# Patient Record
Sex: Male | Born: 1960 | Race: White | Hispanic: No | Smoking: Current some day smoker
Health system: Southern US, Community
[De-identification: ages and names within clinical notes are randomized; demographics above are authoritative.]

## PROBLEM LIST (undated history)

## (undated) DIAGNOSIS — T7840XA Allergy, unspecified, initial encounter: Secondary | ICD-10-CM

## (undated) DIAGNOSIS — I82409 Acute embolism and thrombosis of unspecified deep veins of unspecified lower extremity: Secondary | ICD-10-CM

## (undated) DIAGNOSIS — E785 Hyperlipidemia, unspecified: Secondary | ICD-10-CM

## (undated) DIAGNOSIS — E559 Vitamin D deficiency, unspecified: Secondary | ICD-10-CM

## (undated) DIAGNOSIS — M545 Low back pain, unspecified: Secondary | ICD-10-CM

## (undated) HISTORY — DX: Allergy, unspecified, initial encounter: T78.40XA

## (undated) HISTORY — DX: Hyperlipidemia, unspecified: E78.5

## (undated) HISTORY — PX: WISDOM TOOTH EXTRACTION: SHX21

---

## 2007-12-18 ENCOUNTER — Ambulatory Visit (HOSPITAL_COMMUNITY): Admission: RE | Admit: 2007-12-18 | Discharge: 2007-12-18 | Payer: Self-pay | Admitting: Internal Medicine

## 2012-06-27 ENCOUNTER — Ambulatory Visit (INDEPENDENT_AMBULATORY_CARE_PROVIDER_SITE_OTHER): Payer: BC Managed Care – PPO | Admitting: *Deleted

## 2012-06-27 DIAGNOSIS — M79609 Pain in unspecified limb: Secondary | ICD-10-CM

## 2012-06-27 DIAGNOSIS — M7989 Other specified soft tissue disorders: Secondary | ICD-10-CM

## 2012-07-03 ENCOUNTER — Telehealth: Payer: Self-pay | Admitting: Oncology

## 2012-07-03 NOTE — Telephone Encounter (Signed)
S/W pt in re NP appt 10/22 @ 10:30 w/Dr. Clelia Croft.  Referring Dr. Lucky Cowboy Dx- Abn CBC w/ hx superficial phlebitis x'2. NP packet email.

## 2012-07-03 NOTE — Telephone Encounter (Signed)
C/D 07/03/12 for appt. 07/10/12

## 2012-07-06 ENCOUNTER — Other Ambulatory Visit: Payer: Self-pay | Admitting: Oncology

## 2012-07-06 DIAGNOSIS — I8290 Acute embolism and thrombosis of unspecified vein: Secondary | ICD-10-CM

## 2012-07-10 ENCOUNTER — Encounter: Payer: Self-pay | Admitting: Oncology

## 2012-07-10 ENCOUNTER — Ambulatory Visit (HOSPITAL_BASED_OUTPATIENT_CLINIC_OR_DEPARTMENT_OTHER): Payer: BC Managed Care – PPO | Admitting: Oncology

## 2012-07-10 ENCOUNTER — Other Ambulatory Visit (HOSPITAL_BASED_OUTPATIENT_CLINIC_OR_DEPARTMENT_OTHER): Payer: BC Managed Care – PPO | Admitting: Lab

## 2012-07-10 ENCOUNTER — Telehealth: Payer: Self-pay | Admitting: Oncology

## 2012-07-10 ENCOUNTER — Ambulatory Visit: Payer: BC Managed Care – PPO

## 2012-07-10 VITALS — BP 108/73 | HR 65 | Temp 97.3°F | Resp 20 | Ht 71.0 in | Wt 183.0 lb

## 2012-07-10 DIAGNOSIS — I8000 Phlebitis and thrombophlebitis of superficial vessels of unspecified lower extremity: Secondary | ICD-10-CM

## 2012-07-10 DIAGNOSIS — I8290 Acute embolism and thrombosis of unspecified vein: Secondary | ICD-10-CM

## 2012-07-10 DIAGNOSIS — R634 Abnormal weight loss: Secondary | ICD-10-CM

## 2012-07-10 DIAGNOSIS — R7989 Other specified abnormal findings of blood chemistry: Secondary | ICD-10-CM

## 2012-07-10 DIAGNOSIS — F17200 Nicotine dependence, unspecified, uncomplicated: Secondary | ICD-10-CM

## 2012-07-10 LAB — CBC WITH DIFFERENTIAL/PLATELET
Basophils Absolute: 0.1 10*3/uL (ref 0.0–0.1)
EOS%: 2.5 % (ref 0.0–7.0)
HGB: 20.6 g/dL — ABNORMAL HIGH (ref 13.0–17.1)
MCH: 32.2 pg (ref 27.2–33.4)
MCHC: 33.9 g/dL (ref 32.0–36.0)
MCV: 95.1 fL (ref 79.3–98.0)
MONO%: 8 % (ref 0.0–14.0)
NEUT%: 68.6 % (ref 39.0–75.0)
RDW: 14.1 % (ref 11.0–14.6)

## 2012-07-10 NOTE — Progress Notes (Signed)
CHECKED IN NEW PATIENT. NO FINANCIAL ISSUES. °

## 2012-07-10 NOTE — Addendum Note (Signed)
Addended by: Benjiman Core on: 07/10/2012 12:14 PM   Modules accepted: Orders

## 2012-07-10 NOTE — Telephone Encounter (Signed)
appts made and printed for pt aom °

## 2012-07-10 NOTE — Progress Notes (Signed)
Note dictated

## 2012-07-11 NOTE — Progress Notes (Signed)
CC:   Loree Fee, [place] Lucky Cowboy, M.D.  REASON FOR CONSULTATION:  Superficial thrombophlebitis.  HISTORY OF PRESENT ILLNESS:  Mr. Broughton is a pleasant 51 year old man, a native of [place], with the majority of his life around this area. He is healthy gentleman without any significant past medical history, who back in July 2013, presented with ankle pain and found to have a superficial vein phlebitis noted on his left foot.  Patient treated with antibiotics and anti-inflammatory, however, continued to have persistent pain and tenderness in moving along the aspect of his left leg and left thigh.  The patient had an ultrasound Doppler done on 06/27/2012.  It showed no evidence of any deep vein thrombosis of the left lower extremity.  However, the left great saphenous vein has an occlusive superficial venous thrombophlebitis which appears to be subacute of the calf segment.  The patient was started on aspirin at that time, and referred to me for evaluation.  Also of note, from his primary care physician, noted that his hemoglobin is up to 19.3, white cell count is 6.4, platelet count 131.  Clinically, Mr. Hagemeister feels relatively well. He had lost some weight.  He is a smoker, but he denied any recent travel, has not had any trauma, did not have any period of immobilization.  He did not have any major symptoms.  He had not had any major abdominal discomfort, although he has had some abdominal discomfort at times.  He did not have any pruritus.  He did not have any major changes in his bowel habits.  REVIEW OF SYSTEMS:  He does not report any headaches, blurry vision, double vision.  He does not report any motor or sensory neuropathy.  He does not report any alteration in mental status.  He does not report any psychiatric issues, depression.  He does not report any fever, chills, sweats.  He does not report any cough, hemoptysis, hematemesis.  No nausea, vomiting.  He does not  report any abdominal pain.  No genitourinary complaints.  Rest of review of systems is unremarkable.  PAST MEDICAL HISTORY:  Really unremarkable.  No history of hypertension, diabetes or any malignancies.  MEDICATION:  None other than aspirin prescribed recently.  SOCIAL HISTORY:  He is single.  He has 3 daughters.  Smokes about 5-6 cigarettes a day.  No alcohol abuse.  Drinks about 3-4 beers at the most.  ALLERGIES:  NONE.  FAMILY HISTORY:  Really no history of any deep vein thrombosis.  No history of any specific malignancy or any pregnancy complications.  PHYSICAL EXAMINATION:  General:  Alert awake gentleman, appeared in no active distress.  Vital signs:  Blood pressure 108/73, pulse 65, respirations 20, temperature 97.  HEENT:  Head is normocephalic, atraumatic.  Pupils equal, round, and react to light.  Oral mucosa moist and pink.  Neck:  Supple without adenopathy.  Heart:  Regular rate and rhythm.  S1, S2.  Lungs:  Clear to auscultation without rhonchi, wheeze or dullness to percussion.  Abdomen:  Soft, nontender.  No hepatosplenomegaly.  Extremities:  No edema.  Examination of his left leg there is a palpable superficial vein tender to touch.  LABORATORY DATA:  Hemoglobin 20, white count 5.6, platelet count 121.  ASSESSMENT AND PLAN:  A 51 year old gentleman with the following issues: 1. Superficial phlebitis of his left calf segment of his venous     system.  Differential diagnosis discussed today with Mr. Nolton     including inherited thrombophilia versus  hormone supplements versus     steroid supplements at this time which he denies taking at this     point.  Also we talked about occult malignancy as a possible     offending agent.  Also given his erythrocytosis possibly have a     polycythemia as an offending agent.  At this point in terms of     management and workup, I will obtain a hypercoagulable workup.  At     this time also obtain JAK2 mutation to rule out  polycythemia vera,     and I will like to obtain a CT scan of the chest, abdomen and     pelvis to rule out occult malignancy that could be the cause of his     weight loss given his smoking history and the superficial     phlebitis.  He will follow up with me after that. 2. Elevated hemoglobin.  The differential diagnosis discussed today     including reactive polycythemia due to smoking versus a     polycythemia vera which could say in turn leads to his thrombosis.     I am obtaining a JAK2 mutation again to evaluate.  We talked about     a possible phlebotomy down the line.  He is already on aspirin for     the immediate future, but I talked to him about possible     therapeutic phlebotomy in that time.  All of his questions were     answered today.    ______________________________ Benjiman Core, M.D. FNS/MEDQ  D:  07/10/2012  T:  07/11/2012  Job:  [id number]

## 2012-07-12 LAB — HYPERCOAGULABLE PANEL, COMPREHENSIVE
DRVVT: 36.1 secs (ref <42.9)
Lupus Anticoagulant: NOT DETECTED
Protein C Activity: 152 % — ABNORMAL HIGH (ref 75–133)
Protein C, Total: 84 % (ref 72–160)

## 2012-07-13 ENCOUNTER — Ambulatory Visit (HOSPITAL_COMMUNITY)
Admission: RE | Admit: 2012-07-13 | Discharge: 2012-07-13 | Disposition: A | Discharge status: Home / Self Care | Payer: BC Managed Care – PPO | Source: Ambulatory Visit | Attending: Oncology | Admitting: Oncology

## 2012-07-13 DIAGNOSIS — D582 Other hemoglobinopathies: Secondary | ICD-10-CM | POA: Insufficient documentation

## 2012-07-13 DIAGNOSIS — R198 Other specified symptoms and signs involving the digestive system and abdomen: Secondary | ICD-10-CM | POA: Insufficient documentation

## 2012-07-13 DIAGNOSIS — I8000 Phlebitis and thrombophlebitis of superficial vessels of unspecified lower extremity: Secondary | ICD-10-CM | POA: Insufficient documentation

## 2012-07-13 DIAGNOSIS — R634 Abnormal weight loss: Secondary | ICD-10-CM

## 2012-07-13 DIAGNOSIS — R109 Unspecified abdominal pain: Secondary | ICD-10-CM | POA: Insufficient documentation

## 2012-07-13 DIAGNOSIS — I8290 Acute embolism and thrombosis of unspecified vein: Secondary | ICD-10-CM

## 2012-07-13 DIAGNOSIS — F17200 Nicotine dependence, unspecified, uncomplicated: Secondary | ICD-10-CM | POA: Insufficient documentation

## 2012-07-13 DIAGNOSIS — Z122 Encounter for screening for malignant neoplasm of respiratory organs: Secondary | ICD-10-CM | POA: Insufficient documentation

## 2012-07-13 MED ORDER — IOHEXOL 300 MG/ML  SOLN
100.0000 mL | Freq: Once | INTRAMUSCULAR | Status: AC | PRN
  Administered 2012-07-13: 100 mL via INTRAVENOUS

## 2012-07-24 ENCOUNTER — Ambulatory Visit (HOSPITAL_BASED_OUTPATIENT_CLINIC_OR_DEPARTMENT_OTHER): Payer: BC Managed Care – PPO | Admitting: Oncology

## 2012-07-24 ENCOUNTER — Telehealth: Payer: Self-pay | Admitting: Oncology

## 2012-07-24 VITALS — BP 130/71 | HR 69 | Temp 97.8°F | Resp 20 | Ht 71.0 in | Wt 188.2 lb

## 2012-07-24 DIAGNOSIS — D751 Secondary polycythemia: Secondary | ICD-10-CM

## 2012-07-24 DIAGNOSIS — D582 Other hemoglobinopathies: Secondary | ICD-10-CM

## 2012-07-24 NOTE — Progress Notes (Signed)
Hematology and Oncology Follow Up Visit  Frank Cruz [id number] [birth year] 51 y.o. 07/24/2012 4:20 PM   Principle Diagnosis: 51 year old gentleman with the following issues: 1. Superficial phlebitis of his left calf segment of his venous system. 2. Elevated hemoglobin. The differential diagnosis discussed today including reactive polycythemia   Interim History:  Frank Cruz presents today for a follow up visit. He is here to discuss the results of his work up. He reports feeling well since his last visit. His phlebitis has resolved at this time. He has regained all activity of daily living and want to resume exercising. No new blood clots noted. He did not have any major symptoms. He had not had any major abdominal discomfort, although he has had some abdominal discomfort at times. He did not have any pruritus. He did not have any major changes in his bowel habits.   Medications: I have reviewed the patient's current medications. No current outpatient prescriptions on file.  Allergies: No Known Allergies  Past Medical History, Surgical history, Social history, and Family History were reviewed and updated.  Review of Systems: Constitutional:  Negative for fever, chills, night sweats, anorexia, weight loss, pain. Cardiovascular: no chest pain or dyspnea on exertion Respiratory: negative Neurological: negative Dermatological: negative ENT: negative Skin: Negative. Gastrointestinal: negative Genito-Urinary: negative Hematological and Lymphatic: negative Breast: negative Musculoskeletal: negative Remaining ROS negative. Physical Exam: Blood pressure 130/71, pulse 69, temperature 97.8 F (36.6 C), temperature source Oral, resp. rate 20, height 5\' 11"  (1.803 m), weight 188 lb 3.2 oz (85.367 kg). ECOG: 0 General appearance: alert Head: Normocephalic, without obvious abnormality, atraumatic Neck: no adenopathy, no carotid bruit, no JVD, supple, symmetrical, trachea midline and thyroid  not enlarged, symmetric, no tenderness/mass/nodules Lymph nodes: Cervical, supraclavicular, and axillary nodes normal. Heart:regular rate and rhythm, S1, S2 normal, no murmur, click, rub or gallop Lung:chest clear, no wheezing, rales, normal symmetric air entry Abdomin: soft, non-tender, without masses or organomegaly EXT:no erythema, induration, or nodules   Lab Results: Lab Results  Component Value Date   WBC 5.6 07/10/2012   HGB 20.6* 07/10/2012   HCT 60.9* 07/10/2012   MCV 95.1 07/10/2012   PLT 121* 07/10/2012   Hypercoagulable work up is negative.  JAK-2 mutation was not detected.    CT CHEST, ABDOMEN AND PELVIS WITH CONTRAST  Technique: Multidetector CT imaging of the chest, abdomen and  pelvis was performed following the standard protocol during bolus  administration of intravenous contrast.  Contrast: OMNIPAQUE IOHEXOL 300 MG/ML SOLN  Comparison: None.  CT CHEST  Findings: There are no enlarged mediastinal or hilar lymph nodes.  There is minimal atherosclerosis involving the proximal left  anterior descending coronary artery. The mediastinum otherwise  appears normal.  There is no pleural or pericardial effusion. The lungs are clear.  There are no suspicious osseous findings.  IMPRESSION:  No acute findings or evidence of intrathoracic malignancy.  CT ABDOMEN AND PELVIS  Findings: There is a small cyst in the left hepatic lobe on image  59. The liver otherwise appears normal. Gallbladder, biliary  system and pancreas appear normal. The spleen, adrenal glands and  kidneys appear normal.  No bowel mucosal lesions are identified. There is moderate stool  in the distal colon. The appendix appears normal. 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 The iliac veins and inferior vena cava  appear normal. The bladder, prostate gland and seminal vesicles  appear normal. There is degenerative disc disease at L4-L5.  IMPRESSION:  1. No evidence of abdominal  pelvic  malignancy.  2. No acute abdominal pelvic findings.   Impression and Plan:  51 year old with: 1. Superficial phlebitis of his left calf segment of his venous system. Her hypercoagulable work up and CT scans were discussed today and showed no clear cut malignancy or thrombophilia.  I recommend continue ASA for now. I see no reason for full dose anticoagulation.   2. Elevated hemoglobin. The differential diagnosis discussed today including reactive polycythemia. His JAK-2 mutation was not detected.  I discussed the risks and benefits of phlebotomies in the future if he becomes symptomatic.  I will recheck his counts in 2 months.     Frank Anes, MD 11/5/20134:20 PM

## 2012-07-24 NOTE — Telephone Encounter (Signed)
gv pt appt for Jan 2014...the patient did not want paper schedule...said he rather just get phone call or email

## 2012-09-18 ENCOUNTER — Encounter: Payer: Self-pay | Admitting: Oncology

## 2012-09-20 ENCOUNTER — Telehealth: Payer: Self-pay | Admitting: *Deleted

## 2012-09-20 NOTE — Telephone Encounter (Signed)
Called patient back and made him aware that the only lab being done on 09/25/12 is CBC and those results will be available within 10 minutes. No need to redraw the JAK2 lab. He understands and agrees to keep his 09/25/12 visit w/MD.

## 2012-09-25 ENCOUNTER — Other Ambulatory Visit (HOSPITAL_BASED_OUTPATIENT_CLINIC_OR_DEPARTMENT_OTHER): Payer: BC Managed Care – PPO | Admitting: Lab

## 2012-09-25 ENCOUNTER — Ambulatory Visit (HOSPITAL_BASED_OUTPATIENT_CLINIC_OR_DEPARTMENT_OTHER): Payer: BC Managed Care – PPO | Admitting: Oncology

## 2012-09-25 VITALS — BP 108/66 | HR 60 | Temp 97.6°F | Resp 18 | Ht 71.0 in | Wt 192.8 lb

## 2012-09-25 DIAGNOSIS — I8000 Phlebitis and thrombophlebitis of superficial vessels of unspecified lower extremity: Secondary | ICD-10-CM

## 2012-09-25 DIAGNOSIS — D751 Secondary polycythemia: Secondary | ICD-10-CM

## 2012-09-25 DIAGNOSIS — I8290 Acute embolism and thrombosis of unspecified vein: Secondary | ICD-10-CM

## 2012-09-25 DIAGNOSIS — R7989 Other specified abnormal findings of blood chemistry: Secondary | ICD-10-CM

## 2012-09-25 LAB — CBC WITH DIFFERENTIAL/PLATELET
BASO%: 0.8 % (ref 0.0–2.0)
MCHC: 33.8 g/dL (ref 32.0–36.0)
MONO#: 0.4 10*3/uL (ref 0.1–0.9)
RBC: 4.38 10*6/uL (ref 4.20–5.82)
WBC: 4.7 10*3/uL (ref 4.0–10.3)
lymph#: 1.5 10*3/uL (ref 0.9–3.3)

## 2012-09-25 NOTE — Progress Notes (Signed)
Hematology and Oncology Follow Up Visit  Frank Cruz [id number] 1961/02/21 51 y.o. 09/25/2012 4:37 PM   Principle Diagnosis: 52 year old gentleman with the following issues: 1. Superficial phlebitis of his left calf segment of his venous system. This has resolved. 2. Elevated hemoglobin. The differential diagnosis discussed today including reactive polycythemia. This has resolved.    Interim History:  Frank Cruz presents today for a follow up visit. He reports feeling well since his last visit. His phlebitis has resolved at this time. He has regained all activity of daily living and want to resume exercising. No new blood clots noted. He did not have any major symptoms. He had not had any major abdominal discomfort, although he has had some abdominal discomfort at times. He did not have any pruritus. He did not have any major changes in his bowel habits.   Medications: I have reviewed the patient's current medications. No current outpatient prescriptions on file.  Allergies: No Known Allergies  Past Medical History, Surgical history, Social history, and Family History were reviewed and updated.  Review of Systems: Constitutional:  Negative for fever, chills, night sweats, anorexia, weight loss, pain. Cardiovascular: no chest pain or dyspnea on exertion Respiratory: negative Neurological: negative Dermatological: negative ENT: negative Skin: Negative. Gastrointestinal: negative Genito-Urinary: negative Hematological and Lymphatic: negative Breast: negative Musculoskeletal: negative Remaining ROS negative. Physical Exam: Blood pressure 108/66, pulse 60, temperature 97.6 F (36.4 C), temperature source Oral, resp. rate 18, height 5\' 11"  (1.803 m), weight 192 lb 12.8 oz (87.454 kg). ECOG: 0 General appearance: alert Head: Normocephalic, without obvious abnormality, atraumatic Neck: no adenopathy, no carotid bruit, no JVD, supple, symmetrical, trachea midline and thyroid not  enlarged, symmetric, no tenderness/mass/nodules Lymph nodes: Cervical, supraclavicular, and axillary nodes normal. Heart:regular rate and rhythm, S1, S2 normal, no murmur, click, rub or gallop Lung:chest clear, no wheezing, rales, normal symmetric air entry Abdomin: soft, non-tender, without masses or organomegaly EXT:no erythema, induration, or nodules   Lab Results: Lab Results  Component Value Date   WBC 4.7 09/25/2012   HGB 13.4 09/25/2012   HCT 39.6 09/25/2012   MCV 90.3 09/25/2012   PLT 128* 09/25/2012   Impression and Plan:  52 year old with: 1. Superficial phlebitis of his left calf segment of his venous system. Her hypercoagulable work up and CT scans showed no clear cut malignancy or thrombophilia.  I recommend continue ASA for now. I see no reason for full dose anticoagulation.   2. Elevated hemoglobin. The differential diagnosis discussed today including reactive polycythemia. His JAK-2 mutation was not detected.  This appears to have resolved.  No further intervention needed.  He follow up as needed for now.     Frank Hose, MD 1/7/20144:37 PM

## 2012-11-03 ENCOUNTER — Other Ambulatory Visit: Payer: Self-pay

## 2013-07-25 ENCOUNTER — Other Ambulatory Visit: Payer: Self-pay

## 2014-03-27 ENCOUNTER — Ambulatory Visit (INDEPENDENT_AMBULATORY_CARE_PROVIDER_SITE_OTHER): Payer: BC Managed Care – PPO | Admitting: Emergency Medicine

## 2014-03-27 ENCOUNTER — Encounter: Payer: Self-pay | Admitting: Emergency Medicine

## 2014-03-27 VITALS — BP 124/82 | HR 64 | Temp 98.2°F | Resp 18 | Ht 71.0 in | Wt 178.0 lb

## 2014-03-27 DIAGNOSIS — M5442 Lumbago with sciatica, left side: Secondary | ICD-10-CM

## 2014-03-27 DIAGNOSIS — M5430 Sciatica, unspecified side: Secondary | ICD-10-CM

## 2014-03-27 MED ORDER — HYDROCODONE-ACETAMINOPHEN 5-325 MG PO TABS: 1.0000 | ORAL_TABLET | Freq: Four times a day (QID) | ORAL | Status: DC | PRN

## 2014-03-27 MED ORDER — METHYLPREDNISOLONE (PAK) 4 MG PO TABS: 4.0000 mg | ORAL_TABLET | Freq: Every day | ORAL | Status: DC

## 2014-03-27 NOTE — Patient Instructions (Signed)
Sciatica with Rehab The sciatic nerve runs from the back down the leg and is responsible for sensation and control of the muscles in the back (posterior) side of the thigh, lower leg, and foot. Sciatica is a condition that is characterized by inflammation of this nerve.  SYMPTOMS   Signs of nerve damage, including numbness and/or weakness along the posterior side of the lower extremity.  Pain in the back of the thigh that may also travel down the leg.  Pain that worsens when sitting for long periods of time.  Occasionally, pain in the back or buttock. CAUSES  Inflammation of the sciatic nerve is the cause of sciatica. The inflammation is due to something irritating the nerve. Common sources of irritation include:  Sitting for long periods of time.  Direct trauma to the nerve.  Arthritis of the spine.  Herniated or ruptured disk.  Slipping of the vertebrae (spondylolithesis)  Pressure from soft tissues, such as muscles or ligament-like tissue (fascia). RISK INCREASES WITH:  Sports that place pressure or stress on the spine (football or weightlifting).  Poor strength and flexibility.  Failure to warm-up properly before activity.  Family history of low back pain or disk disorders.  Previous back injury or surgery.  Poor body mechanics, especially when lifting, or poor posture. PREVENTION   Warm up and stretch properly before activity.  Maintain physical fitness:  Strength, flexibility, and endurance.  Cardiovascular fitness.  Learn and use proper technique, especially with posture and lifting. When possible, have coach correct improper technique.  Avoid activities that place stress on the spine. PROGNOSIS If treated properly, then sciatica usually resolves within 6 weeks. However, occasionally surgery is necessary.  RELATED COMPLICATIONS   Permanent nerve damage, including pain, numbness, tingle, or weakness.  Chronic back pain.  Risks of surgery: infection,  bleeding, nerve damage, or damage to surrounding tissues. TREATMENT Treatment initially involves resting from any activities that aggravate your symptoms. The use of ice and medication may help reduce pain and inflammation. The use of strengthening and stretching exercises may help reduce pain with activity. These exercises may be performed at home or with referral to a therapist. A therapist may recommend further treatments, such as transcutaneous electronic nerve stimulation (TENS) or ultrasound. Your caregiver may recommend corticosteroid injections to help reduce inflammation of the sciatic nerve. If symptoms persist despite non-surgical (conservative) treatment, then surgery may be recommended. MEDICATION  If pain medication is necessary, then nonsteroidal anti-inflammatory medications, such as aspirin and ibuprofen, or other minor pain relievers, such as acetaminophen, are often recommended.  Do not take pain medication for 7 days before surgery.  Prescription pain relievers may be given if deemed necessary by your caregiver. Use only as directed and only as much as you need.  Ointments applied to the skin may be helpful.  Corticosteroid injections may be given by your caregiver. These injections should be reserved for the most serious cases, because they may only be given a certain number of times. HEAT AND COLD  Cold treatment (icing) relieves pain and reduces inflammation. Cold treatment should be applied for 10 to 15 minutes every 2 to 3 hours for inflammation and pain and immediately after any activity that aggravates your symptoms. Use ice packs or massage the area with a piece of ice (ice massage).  Heat treatment may be used prior to performing the stretching and strengthening activities prescribed by your caregiver, physical therapist, or athletic trainer. Use a heat pack or soak the injury in warm water. SEEK  MEDICAL CARE IF:  Treatment seems to offer no benefit, or the condition  worsens.  Any medications produce adverse side effects. EXERCISES  RANGE OF MOTION (ROM) AND STRETCHING EXERCISES - Sciatica Most people with sciatic will find that their symptoms worsen with either excessive bending forward (flexion) or arching at the low back (extension). The exercises which will help resolve your symptoms will focus on the opposite motion. Your physician, physical therapist or athletic trainer will help you determine which exercises will be most helpful to resolve your low back pain. Do not complete any exercises without first consulting with your clinician. Discontinue any exercises which worsen your symptoms until you speak to your clinician. If you have pain, numbness or tingling which travels down into your buttocks, leg or foot, the goal of the therapy is for these symptoms to move closer to your back and eventually resolve. Occasionally, these leg symptoms will get better, but your low back pain may worsen; this is typically an indication of progress in your rehabilitation. Be certain to be very alert to any changes in your symptoms and the activities in which you participated in the 24 hours prior to the change. Sharing this information with your clinician will allow him/her to most efficiently treat your condition. These exercises may help you when beginning to rehabilitate your injury. Your symptoms may resolve with or without further involvement from your physician, physical therapist or athletic trainer. While completing these exercises, remember:   Restoring tissue flexibility helps normal motion to return to the joints. This allows healthier, less painful movement and activity.  An effective stretch should be held for at least 30 seconds.  A stretch should never be painful. You should only feel a gentle lengthening or release in the stretched tissue. FLEXION RANGE OF MOTION AND STRETCHING EXERCISES: STRETCH - Flexion, Single Knee to Chest   Lie on a firm bed or floor  with both legs extended in front of you.  Keeping one leg in contact with the floor, bring your opposite knee to your chest. Hold your leg in place by either grabbing behind your thigh or at your knee.  Pull until you feel a gentle stretch in your low back. Hold __________ seconds.  Slowly release your grasp and repeat the exercise with the opposite side. Repeat __________ times. Complete this exercise __________ times per day.  STRETCH - Flexion, Double Knee to Chest  Lie on a firm bed or floor with both legs extended in front of you.  Keeping one leg in contact with the floor, bring your opposite knee to your chest.  Tense your stomach muscles to support your back and then lift your other knee to your chest. Hold your legs in place by either grabbing behind your thighs or at your knees.  Pull both knees toward your chest until you feel a gentle stretch in your low back. Hold __________ seconds.  Tense your stomach muscles and slowly return one leg at a time to the floor. Repeat __________ times. Complete this exercise __________ times per day.  STRETCH - Low Trunk Rotation   Lie on a firm bed or floor. Keeping your legs in front of you, bend your knees so they are both pointed toward the ceiling and your feet are flat on the floor.  Extend your arms out to the side. This will stabilize your upper body by keeping your shoulders in contact with the floor.  Gently and slowly drop both knees together to one side until you  feel a gentle stretch in your low back. Hold for __________ seconds.  Tense your stomach muscles to support your low back as you bring your knees back to the starting position. Repeat the exercise to the other side. Repeat __________ times. Complete this exercise __________ times per day  EXTENSION RANGE OF MOTION AND FLEXIBILITY EXERCISES: STRETCH - Extension, Prone on Elbows  Lie on your stomach on the floor, a bed will be too soft. Place your palms about shoulder  width apart and at the height of your head.  Place your elbows under your shoulders. If this is too painful, stack pillows under your chest.  Allow your body to relax so that your hips drop lower and make contact more completely with the floor.  Hold this position for __________ seconds.  Slowly return to lying flat on the floor. Repeat __________ times. Complete this exercise __________ times per day.  RANGE OF MOTION - Extension, Prone Press Ups  Lie on your stomach on the floor, a bed will be too soft. Place your palms about shoulder width apart and at the height of your head.  Keeping your back as relaxed as possible, slowly straighten your elbows while keeping your hips on the floor. You may adjust the placement of your hands to maximize your comfort. As you gain motion, your hands will come more underneath your shoulders.  Hold this position __________ seconds.  Slowly return to lying flat on the floor. Repeat __________ times. Complete this exercise __________ times per day.  STRENGTHENING EXERCISES - Sciatica  These exercises may help you when beginning to rehabilitate your injury. These exercises should be done near your "sweet spot." This is the neutral, low-back arch, somewhere between fully rounded and fully arched, that is your least painful position. When performed in this safe range of motion, these exercises can be used for people who have either a flexion or extension based injury. These exercises may resolve your symptoms with or without further involvement from your physician, physical therapist or athletic trainer. While completing these exercises, remember:   Muscles can gain both the endurance and the strength needed for everyday activities through controlled exercises.  Complete these exercises as instructed by your physician, physical therapist or athletic trainer. Progress with the resistance and repetition exercises only as your caregiver advises.  You may  experience muscle soreness or fatigue, but the pain or discomfort you are trying to eliminate should never worsen during these exercises. If this pain does worsen, stop and make certain you are following the directions exactly. If the pain is still present after adjustments, discontinue the exercise until you can discuss the trouble with your clinician. STRENGTHENING - Deep Abdominals, Pelvic Tilt   Lie on a firm bed or floor. Keeping your legs in front of you, bend your knees so they are both pointed toward the ceiling and your feet are flat on the floor.  Tense your lower abdominal muscles to press your low back into the floor. This motion will rotate your pelvis so that your tail bone is scooping upwards rather than pointing at your feet or into the floor.  With a gentle tension and even breathing, hold this position for __________ seconds. Repeat __________ times. Complete this exercise __________ times per day.  STRENGTHENING - Abdominals, Crunches   Lie on a firm bed or floor. Keeping your legs in front of you, bend your knees so they are both pointed toward the ceiling and your feet are flat on the floor.  Cross your arms over your chest.  Slightly tip your chin down without bending your neck.  Tense your abdominals and slowly lift your trunk high enough to just clear your shoulder blades. Lifting higher can put excessive stress on the low back and does not further strengthen your abdominal muscles.  Control your return to the starting position. Repeat __________ times. Complete this exercise __________ times per day.  STRENGTHENING - Quadruped, Opposite UE/LE Lift  Assume a hands and knees position on a firm surface. Keep your hands under your shoulders and your knees under your hips. You may place padding under your knees for comfort.  Find your neutral spine and gently tense your abdominal muscles so that you can maintain this position. Your shoulders and hips should form a rectangle  that is parallel with the floor and is not twisted.  Keeping your trunk steady, lift your right hand no higher than your shoulder and then your left leg no higher than your hip. Make sure you are not holding your breath. Hold this position __________ seconds.  Continuing to keep your abdominal muscles tense and your back steady, slowly return to your starting position. Repeat with the opposite arm and leg. Repeat __________ times. Complete this exercise __________ times per day.  STRENGTHENING - Abdominals and Quadriceps, Straight Leg Raise   Lie on a firm bed or floor with both legs extended in front of you.  Keeping one leg in contact with the floor, bend the other knee so that your foot can rest flat on the floor.  Find your neutral spine, and tense your abdominal muscles to maintain your spinal position throughout the exercise.  Slowly lift your straight leg off the floor about 6 inches for a count of 15, making sure to not hold your breath.  Still keeping your neutral spine, slowly lower your leg all the way to the floor. Repeat this exercise with each leg __________ times. Complete this exercise __________ times per day. POSTURE AND BODY MECHANICS CONSIDERATIONS - Sciatica Keeping correct posture when sitting, standing or completing your activities will reduce the stress put on different body tissues, allowing injured tissues a chance to heal and limiting painful experiences. The following are general guidelines for improved posture. Your physician or physical therapist will provide you with any instructions specific to your needs. While reading these guidelines, remember:  The exercises prescribed by your provider will help you have the flexibility and strength to maintain correct postures.  The correct posture provides the optimal environment for your joints to work. All of your joints have less wear and tear when properly supported by a spine with good posture. This means you will  experience a healthier, less painful body.  Correct posture must be practiced with all of your activities, especially prolonged sitting and standing. Correct posture is as important when doing repetitive low-stress activities (typing) as it is when doing a single heavy-load activity (lifting). RESTING POSITIONS Consider which positions are most painful for you when choosing a resting position. If you have pain with flexion-based activities (sitting, bending, stooping, squatting), choose a position that allows you to rest in a less flexed posture. You would want to avoid curling into a fetal position on your side. If your pain worsens with extension-based activities (prolonged standing, working overhead), avoid resting in an extended position such as sleeping on your stomach. Most people will find more comfort when they rest with their spine in a more neutral position, neither too rounded nor too arched.  Lying on a non-sagging bed on your side with a pillow between your knees, or on your back with a pillow under your knees will often provide some relief. Keep in mind, being in any one position for a prolonged period of time, no matter how correct your posture, can still lead to stiffness. PROPER SITTING POSTURE In order to minimize stress and discomfort on your spine, you must sit with correct posture Sitting with good posture should be effortless for a healthy body. Returning to good posture is a gradual process. Many people can work toward this most comfortably by using various supports until they have the flexibility and strength to maintain this posture on their own. When sitting with proper posture, your ears will fall over your shoulders and your shoulders will fall over your hips. You should use the back of the chair to support your upper back. Your low back will be in a neutral position, just slightly arched. You may place a small pillow or folded towel at the base of your low back for support.  When  working at a desk, create an environment that supports good, upright posture. Without extra support, muscles fatigue and lead to excessive strain on joints and other tissues. Keep these recommendations in mind: CHAIR:   A chair should be able to slide under your desk when your back makes contact with the back of the chair. This allows you to work closely.  The chair's height should allow your eyes to be level with the upper part of your monitor and your hands to be slightly lower than your elbows. BODY POSITION  Your feet should make contact with the floor. If this is not possible, use a foot rest.  Keep your ears over your shoulders. This will reduce stress on your neck and low back. INCORRECT SITTING POSTURES   If you are feeling tired and unable to assume a healthy sitting posture, do not slouch or slump. This puts excessive strain on your back tissues, causing more damage and pain. Healthier options include:  Using more support, like a lumbar pillow.  Switching tasks to something that requires you to be upright or walking.  Talking a brief walk.  Lying down to rest in a neutral-spine position. PROLONGED STANDING WHILE SLIGHTLY LEANING FORWARD  When completing a task that requires you to lean forward while standing in one place for a long time, place either foot up on a stationary 2-4 inch high object to help maintain the best posture. When both feet are on the ground, the low back tends to lose its slight inward curve. If this curve flattens (or becomes too large), then the back and your other joints will experience too much stress, fatigue more quickly and can cause pain.  CORRECT STANDING POSTURES Proper standing posture should be assumed with all daily activities, even if they only take a few moments, like when brushing your teeth. As in sitting, your ears should fall over your shoulders and your shoulders should fall over your hips. You should keep a slight tension in your abdominal  muscles to brace your spine. Your tailbone should point down to the ground, not behind your body, resulting in an over-extended swayback posture.  INCORRECT STANDING POSTURES  Common incorrect standing postures include a forward head, locked knees and/or an excessive swayback. WALKING Walk with an upright posture. Your ears, shoulders and hips should all line-up. PROLONGED ACTIVITY IN A FLEXED POSITION When completing a task that requires you to bend forward at  your waist or lean over a low surface, try to find a way to stabilize 3 of 4 of your limbs. You can place a hand or elbow on your thigh or rest a knee on the surface you are reaching across. This will provide you more stability so that your muscles do not fatigue as quickly. By keeping your knees relaxed, or slightly bent, you will also reduce stress across your low back. CORRECT LIFTING TECHNIQUES DO :   Assume a wide stance. This will provide you more stability and the opportunity to get as close as possible to the object which you are lifting.  Tense your abdominals to brace your spine; then bend at the knees and hips. Keeping your back locked in a neutral-spine position, lift using your leg muscles. Lift with your legs, keeping your back straight.  Test the weight of unknown objects before attempting to lift them.  Try to keep your elbows locked down at your sides in order get the best strength from your shoulders when carrying an object.  Always ask for help when lifting heavy or awkward objects. INCORRECT LIFTING TECHNIQUES DO NOT:   Lock your knees when lifting, even if it is a small object.  Bend and twist. Pivot at your feet or move your feet when needing to change directions.  Assume that you cannot safely pick up a paperclip without proper posture. Document Released: 09/05/2005 Document Revised: 11/28/2011 Document Reviewed: 12/18/2008 [facility] Patient Information 2015 [place], [place]. This information is not intended to  replace advice given to you by your health care provider. Make sure you discuss any questions you have with your health care provider.

## 2014-03-27 NOTE — Progress Notes (Signed)
   Subjective:    Patient ID: Frank Cruz, male    DOB: May 04, 1961, 53 y.o.   MRN: [id number]  HPI Comments: 53 yo WM with increased running/ exercise has noticed increased left leg heavy/ dead feeling. He has also been working more on remodeling a house and noticed increased left hip/ leg pain. He notes uncomfortable with sitting and standing for long periods. He notes heating pad/ tens/ pain medicine has not helped. He denies specific injury.  Hip Pain   Leg Pain      Medication List    Notice As of 03/27/2014  3:19 PM   You have not been prescribed any medications.     No Known Allergies  No past medical history on file.     Review of Systems  Musculoskeletal: Positive for back pain.  All other systems reviewed and are negative.  BP 124/82  Pulse 64  Temp(Src) 98.2 F (36.8 C) (Temporal)  Resp 18  Ht 5\' 11"  (1.803 m)  Wt 178 lb (80.74 kg)  BMI 24.84 kg/m2     Objective:   Physical Exam  Nursing note and vitals reviewed. Constitutional: He is oriented to person, place, and time. He appears well-developed and well-nourished.  HENT:  Head: Normocephalic and atraumatic.  Right Ear: External ear normal.  Left Ear: External ear normal.  Nose: Nose normal.  Mouth/Throat: No oropharyngeal exudate.  Eyes: Conjunctivae are normal.  Neck: Normal range of motion.  Cardiovascular: Normal rate, regular rhythm, normal heart sounds and intact distal pulses.   Pulmonary/Chest: Effort normal and breath sounds normal.  Musculoskeletal: Normal range of motion. He exhibits tenderness.  + SLR Left  Lymphadenopathy:    He has no cervical adenopathy.  Neurological: He is alert and oriented to person, place, and time. He has normal reflexes. No cranial nerve deficit. Coordination normal.  Skin: Skin is warm and dry.  Psychiatric: He has a normal mood and affect. Judgment normal.          Assessment & Plan:  ? Sciatica vs DDD- Medrol DP 4 mg AD, Norco 5 Sparingly and do  not use when driving or working with equipment of any kind. Heat/ stretch/ ice. Call if no change for Lumbar xrays.

## 2014-04-09 ENCOUNTER — Ambulatory Visit (INDEPENDENT_AMBULATORY_CARE_PROVIDER_SITE_OTHER): Payer: BC Managed Care – PPO | Admitting: Internal Medicine

## 2014-04-09 ENCOUNTER — Encounter: Payer: Self-pay | Admitting: Internal Medicine

## 2014-04-09 VITALS — BP 106/74 | HR 76 | Temp 97.6°F | Resp 16 | Ht 71.75 in | Wt 174.6 lb

## 2014-04-09 DIAGNOSIS — M5442 Lumbago with sciatica, left side: Secondary | ICD-10-CM

## 2014-04-09 DIAGNOSIS — M5432 Sciatica, left side: Secondary | ICD-10-CM

## 2014-04-09 DIAGNOSIS — M5430 Sciatica, unspecified side: Secondary | ICD-10-CM

## 2014-04-09 MED ORDER — HYDROCODONE-ACETAMINOPHEN 5-325 MG PO TABS: ORAL_TABLET | ORAL | Status: DC

## 2014-04-09 MED ORDER — PREDNISONE 20 MG PO TABS: ORAL_TABLET | ORAL | Status: DC

## 2014-04-09 MED ORDER — GABAPENTIN 300 MG PO CAPS: ORAL_CAPSULE | ORAL | Status: DC

## 2014-04-09 NOTE — Patient Instructions (Signed)
Degenerative Disk Disease Degenerative disk disease is a condition caused by the changes that occur in the cushions of the backbone (spinal disks) as you grow older. Spinal disks are soft and compressible disks located between the bones of the spine (vertebrae). They act like shock absorbers. Degenerative disk disease can affect the whole spine. However, the neck and lower back are most commonly affected. Many changes can occur in the spinal disks with aging, such as:  The spinal disks may dry and shrink.  Small tears may occur in the tough, outer covering of the disk (annulus).  The disk space may become smaller due to loss of water.  Abnormal growths in the bone (spurs) may occur. This can put pressure on the nerve roots exiting the spinal canal, causing pain.  The spinal canal may become narrowed. CAUSES  Degenerative disk disease is a condition caused by the changes that occur in the spinal disks with aging. The exact cause is not known, but there is a genetic basis for many patients. Degenerative changes can occur due to loss of fluid in the disk. This makes the disk thinner and reduces the space between the backbones. Small cracks can develop in the outer layer of the disk. This can lead to the breakdown of the disk. You are more likely to get degenerative disk disease if you are overweight. Smoking cigarettes and doing heavy work such as weightlifting can also increase your risk of this condition. Degenerative changes can start after a sudden injury. Growth of bone spurs can compress the nerve roots and cause pain.  SYMPTOMS  The symptoms vary from person to person. Some people may have no pain, while others have severe pain. The pain may be so severe that it can limit your activities. The location of the pain depends on the part of your backbone that is affected. You will have neck or arm pain if a disk in the neck area is affected. You will have pain in your back, buttocks, or legs if a disk  in the lower back is affected. The pain becomes worse while bending, reaching up, or with twisting movements. The pain may start gradually and then get worse as time passes. It may also start after a major or minor injury. You may feel numbness or tingling in the arms or legs.  DIAGNOSIS  Your caregiver will ask you about your symptoms and about activities or habits that may cause the pain. He or she may also ask about any injuries, diseases, or treatments you have had earlier. Your caregiver will examine you to check for the range of movement that is possible in the affected area, to check for strength in your extremities, and to check for sensation in the areas of the arms and legs supplied by different nerve roots. An X-ray of the spine may be taken. Your caregiver may suggest other imaging tests, such as magnetic resonance imaging (MRI), if needed.  TREATMENT  Treatment includes rest, modifying your activities, and applying ice and heat. Your caregiver may prescribe medicines to reduce your pain and may ask you to do some exercises to strengthen your back. In some cases, you may need surgery. You and your caregiver will decide on the treatment that is best for you. HOME CARE INSTRUCTIONS   Follow proper lifting and walking techniques as advised by your caregiver.  Maintain good posture.  Exercise regularly as advised.  Perform relaxation exercises.  Change your sitting, standing, and sleeping habits as advised. Change positions   frequently. °· Lose weight as advised. °· Stop smoking if you smoke. °· Wear supportive footwear. °SEEK MEDICAL CARE IF:  °Your pain does not go away within 1 to 4 weeks. °SEEK IMMEDIATE MEDICAL CARE IF:  °· Your pain is severe. °· You notice weakness in your arms, hands, or legs. °· You begin to lose control of your bladder or bowel movements. °MAKE SURE YOU:  °· Understand these instructions. °· Will watch your condition. °· Will get help right away if you are not doing  well or get worse. °Document Released: 07/03/2007 Document Revised: 11/28/2011 Document Reviewed: 07/03/2007 °[employer]® Patient Information ©2015 [employer]. This information is not intended to replace advice given to you by your health care provider. Make sure you discuss any questions you have with your health care provider. ° °

## 2014-04-09 NOTE — Progress Notes (Signed)
   Subjective:    Patient ID: Frank Cruz, male    DOB: 04-11-1961, 53 y.o.   MRN: [id number]  Leg Pain  Incident onset: Two weeks ago patient was treated with medrol 4 mg dose pk with slight improvement , then return of Sx's which have gradually worsened. There was no injury mechanism. The pain is present in the left hip, left leg and left thigh (Dull pain radiates down the lateral LLE from the butt to the ankle.). The pain is at a severity of 9/10. The pain is severe. The pain has been worsening since onset. Pertinent negatives include no muscle weakness. Exacerbated by: sitting. Treatments tried: steroids as above. The treatment provided no relief.   No current outpatient prescriptions on file prior to visit.   No current facility-administered medications on file prior to visit.   No Known Allergies  Review of Systems In addition to the HPI above,  No Fever-chills,  No Headache, No changes with Vision or hearing,  No problems swallowing food or Liquids,  No Chest pain or productive Cough or Shortness of Breath,  No Abdominal pain, No Nausea or Vomitting, Bowel movements are regular,  No Blood in stool or Urine,  No dysuria,  No new skin rashes or bruises,  c/o weakness, tingling, numbness in Lt footextremity,  No significant Mental Stressors.  A full 10 point Review of Systems was done, except as stated above, all other Review of Systems were negative  BP 106/74  Pulse 76  Temp(Src) 97.6 F (36.4 C) (Temporal)  Resp 16  Ht 5' 11.75" (1.822 m)  Wt 174 lb 9.6 oz (79.198 kg)  BMI 23.86 kg/m2 Objective:   Physical Exam  Constitutional: He appears well-developed. No distress.  HENT:  Negative  Eyes: EOM are normal. Pupils are equal, round, and reactive to light.  Neck: Normal range of motion. No JVD present. No thyromegaly present.  Cardiovascular: Normal rate, regular rhythm and normal heart sounds.   No murmur heard. Pulmonary/Chest: Effort normal and breath sounds  normal. No respiratory distress. He has no wheezes. He has no rales.  Abdominal: Soft.  Musculoskeletal:  (+) SLR on the Left.  Lymphadenopathy:    He has no cervical adenopathy.   Assessment & Plan:   1. Left-sided low back pain with left-sided sciatica  2. Sciatica, left  - Rx Prednisone pulse/taper, Norco 5 prn, Neurontin 300 mg - 1-2 qpm/hs - if not significant improvement in the next 7-10 days -  consider MRI.

## 2014-04-14 ENCOUNTER — Ambulatory Visit: Payer: Self-pay | Admitting: Internal Medicine

## 2014-05-03 DIAGNOSIS — J309 Allergic rhinitis, unspecified: Secondary | ICD-10-CM | POA: Insufficient documentation

## 2014-05-03 DIAGNOSIS — E785 Hyperlipidemia, unspecified: Secondary | ICD-10-CM | POA: Insufficient documentation

## 2014-05-07 ENCOUNTER — Other Ambulatory Visit: Payer: Self-pay | Admitting: Internal Medicine

## 2014-05-07 ENCOUNTER — Encounter: Payer: Self-pay | Admitting: Internal Medicine

## 2014-05-07 ENCOUNTER — Ambulatory Visit (INDEPENDENT_AMBULATORY_CARE_PROVIDER_SITE_OTHER): Payer: BC Managed Care – PPO | Admitting: Internal Medicine

## 2014-05-07 VITALS — BP 102/78 | HR 68 | Temp 97.5°F | Resp 16 | Ht 71.25 in | Wt 175.5 lb

## 2014-05-07 DIAGNOSIS — M545 Low back pain, unspecified: Secondary | ICD-10-CM | POA: Insufficient documentation

## 2014-05-07 DIAGNOSIS — Z0000 Encounter for general adult medical examination without abnormal findings: Secondary | ICD-10-CM

## 2014-05-07 DIAGNOSIS — Z23 Encounter for immunization: Secondary | ICD-10-CM

## 2014-05-07 DIAGNOSIS — Z111 Encounter for screening for respiratory tuberculosis: Secondary | ICD-10-CM

## 2014-05-07 DIAGNOSIS — E782 Mixed hyperlipidemia: Secondary | ICD-10-CM

## 2014-05-07 DIAGNOSIS — R740 Nonspecific elevation of levels of transaminase and lactic acid dehydrogenase [LDH]: Secondary | ICD-10-CM

## 2014-05-07 DIAGNOSIS — E559 Vitamin D deficiency, unspecified: Secondary | ICD-10-CM

## 2014-05-07 DIAGNOSIS — R030 Elevated blood-pressure reading, without diagnosis of hypertension: Secondary | ICD-10-CM

## 2014-05-07 DIAGNOSIS — Z79899 Other long term (current) drug therapy: Secondary | ICD-10-CM

## 2014-05-07 DIAGNOSIS — R7402 Elevation of levels of lactic acid dehydrogenase (LDH): Secondary | ICD-10-CM

## 2014-05-07 DIAGNOSIS — Z113 Encounter for screening for infections with a predominantly sexual mode of transmission: Secondary | ICD-10-CM

## 2014-05-07 DIAGNOSIS — Z1212 Encounter for screening for malignant neoplasm of rectum: Secondary | ICD-10-CM

## 2014-05-07 DIAGNOSIS — Z125 Encounter for screening for malignant neoplasm of prostate: Secondary | ICD-10-CM

## 2014-05-07 DIAGNOSIS — R7401 Elevation of levels of liver transaminase levels: Secondary | ICD-10-CM

## 2014-05-07 LAB — CBC WITH DIFFERENTIAL/PLATELET
BASOS ABS: 0.1 10*3/uL (ref 0.0–0.1)
Basophils Relative: 2 % — ABNORMAL HIGH (ref 0–1)
EOS ABS: 0.1 10*3/uL (ref 0.0–0.7)
EOS PCT: 2 % (ref 0–5)
HCT: 42.8 % (ref 39.0–52.0)
Hemoglobin: 14.9 g/dL (ref 13.0–17.0)
LYMPHS PCT: 37 % (ref 12–46)
Lymphs Abs: 1.7 10*3/uL (ref 0.7–4.0)
MCH: 31.5 pg (ref 26.0–34.0)
MCHC: 34.8 g/dL (ref 30.0–36.0)
MCV: 90.5 fL (ref 78.0–100.0)
Monocytes Absolute: 0.3 10*3/uL (ref 0.1–1.0)
Monocytes Relative: 6 % (ref 3–12)
NEUTROS PCT: 53 % (ref 43–77)
Neutro Abs: 2.4 10*3/uL (ref 1.7–7.7)
PLATELETS: 141 10*3/uL — AB (ref 150–400)
RBC: 4.73 MIL/uL (ref 4.22–5.81)
RDW: 13.4 % (ref 11.5–15.5)
WBC: 4.6 10*3/uL (ref 4.0–10.5)

## 2014-05-07 MED ORDER — PREDNISONE 20 MG PO TABS: ORAL_TABLET | ORAL | Status: DC

## 2014-05-07 NOTE — Patient Instructions (Signed)
Recommend the book "The END of DIETING" by Dr Baker Janus   and the book "The END of DIABETES " by Dr Excell Seltzer  At [email or web address] - get book & Audio CD's      Being diabetic has a  300% increased risk for heart attack, stroke, cancer, and alzheimer- type vascular dementia. It is very important that you work harder with diet by avoiding all foods that are white except chicken & fish. Avoid white rice (brown & wild rice is OK), white potatoes (sweetpotatoes in moderation is OK), White bread or wheat bread or anything made out of white flour like bagels, donuts, rolls, buns, biscuits, cakes, pastries, cookies, pizza crust, and pasta (made from white flour & egg whites) - vegetarian pasta or spinach or wheat pasta is OK. Multigrain breads like Arnold's or [employer], or multigrain sandwich thins or flatbreads.  Diet, exercise and weight loss can reverse and cure diabetes in the early stages.  Diet, exercise and weight loss is very important in the control and prevention of complications of diabetes which affects every system in your body, ie. Brain - dementia/stroke, eyes - glaucoma/blindness, heart - heart attack/heart failure, kidneys - dialysis, stomach - gastric paralysis, intestines - malabsorption, nerves - severe painful neuritis, circulation - gangrene & loss of a leg(s), and finally cancer and Alzheimers.    I recommend avoid fried & greasy foods,  sweets/candy, white rice (brown or wild rice or Quinoa is OK), white potatoes (sweet potatoes are OK) - anything made from white flour - bagels, doughnuts, rolls, buns, biscuits,white and wheat breads, pizza crust and traditional pasta made of white flour & egg white(vegetarian pasta or spinach or wheat pasta is OK).  Multi-grain bread is OK - like multi-grain flat bread or sandwich thins. Avoid alcohol in excess. Exercise is also important.    Eat all the vegetables you want - avoid meat, especially red meat and dairy - especially cheese.  Cheese  is the most concentrated form of trans-fats which is the worst thing to clog up our arteries. Veggie cheese is OK which can be found in the fresh produce section at [employer] or [employer] or [employer]  Preventive Care for Adults A healthy lifestyle and preventive care can promote health and wellness. Preventive health guidelines for men include the following key practices:  A routine yearly physical is a good way to check with your health care provider about your health and preventative screening. It is a chance to share any concerns and updates on your health and to receive a thorough exam.  Visit your dentist for a routine exam and preventative care every 6 months. Brush your teeth twice a day and floss once a day. Good oral hygiene prevents tooth decay and gum disease.  The frequency of eye exams is based on your age, health, family medical history, use of contact lenses, and other factors. Follow your health care provider's recommendations for frequency of eye exams.  Eat a healthy diet. Foods such as vegetables, fruits, whole grains, low-fat dairy products, and lean protein foods contain the nutrients you need without too many calories. Decrease your intake of foods high in solid fats, added sugars, and salt. Eat the right amount of calories for you.Get information about a proper diet from your health care provider, if necessary.  Regular physical exercise is one of the most important things you can do for your health. Most adults should get at least 150 minutes of moderate-intensity exercise (any activity that  increases your heart rate and causes you to sweat) each week. In addition, most adults need muscle-strengthening exercises on 2 or more days a week.  Maintain a healthy weight. The body mass index (BMI) is a screening tool to identify possible weight problems. It provides an estimate of body fat based on height and weight. Your health care provider can find your BMI and can help you  achieve or maintain a healthy weight.For adults 20 years and older:  A BMI below 18.5 is considered underweight.  A BMI of 18.5 to 24.9 is normal.  A BMI of 25 to 29.9 is considered overweight.  A BMI of 30 and above is considered obese.  Maintain normal blood lipids and cholesterol levels by exercising and minimizing your intake of saturated fat. Eat a balanced diet with plenty of fruit and vegetables. Blood tests for lipids and cholesterol should begin at age 41 and be repeated every 5 years. If your lipid or cholesterol levels are high, you are over 50, or you are at high risk for heart disease, you may need your cholesterol levels checked more frequently.Ongoing high lipid and cholesterol levels should be treated with medicines if diet and exercise are not working.  If you smoke, find out from your health care provider how to quit. If you do not use tobacco, do not start.  Lung cancer screening is recommended for adults aged 38-80 years who are at high risk for developing lung cancer because of a history of smoking. A yearly low-dose CT scan of the lungs is recommended for people who have at least a 30-pack-year history of smoking and are a current smoker or have quit within the past 15 years. A pack year of smoking is smoking an average of 1 pack of cigarettes a day for 1 year (for example: 1 pack a day for 30 years or 2 packs a day for 15 years). Yearly screening should continue until the smoker has stopped smoking for at least 15 years. Yearly screening should be stopped for people who develop a health problem that would prevent them from having lung cancer treatment.  If you choose to drink alcohol, do not have more than 2 drinks per day. One drink is considered to be 12 ounces (355 mL) of beer, 5 ounces (148 mL) of wine, or 1.5 ounces (44 mL) of liquor.  Avoid use of street drugs. Do not share needles with anyone. Ask for help if you need support or instructions about stopping the use of  drugs.  High blood pressure causes heart disease and increases the risk of stroke. Your blood pressure should be checked at least every 1-2 years. Ongoing high blood pressure should be treated with medicines, if weight loss and exercise are not effective.  If you are 34-63 years old, ask your health care provider if you should take aspirin to prevent heart disease.  Diabetes screening involves taking a blood sample to check your fasting blood sugar level. This should be done once every 3 years, after age 23, if you are within normal weight and without risk factors for diabetes. Testing should be considered at a younger age or be carried out more frequently if you are overweight and have at least 1 risk factor for diabetes.  Colorectal cancer can be detected and often prevented. Most routine colorectal cancer screening begins at the age of 25 and continues through age 90. However, your health care provider may recommend screening at an earlier age if you have risk  factors for colon cancer. On a yearly basis, your health care provider may provide home test kits to check for hidden blood in the stool. Use of a small camera at the end of a tube to directly examine the colon (sigmoidoscopy or colonoscopy) can detect the earliest forms of colorectal cancer. Talk to your health care provider about this at age 48, when routine screening begins. Direct exam of the colon should be repeated every 5-10 years through age 60, unless early forms of precancerous polyps or small growths are found.  People who are at an increased risk for hepatitis B should be screened for this virus. You are considered at high risk for hepatitis B if:  You were born in a country where hepatitis B occurs often. Talk with your health care provider about which countries are considered high risk.  Your parents were born in a high-risk country and you have not received a shot to protect against hepatitis B (hepatitis B vaccine).  You have  HIV or AIDS.  You use needles to inject street drugs.  You live with, or have sex with, someone who has hepatitis B.  You are a man who has sex with other men (MSM).  You get hemodialysis treatment.  You take certain medicines for conditions such as cancer, organ transplantation, and autoimmune conditions.  Hepatitis C blood testing is recommended for all people born from [birth year] through 1965 and any individual with known risks for hepatitis C.  Practice safe sex. Use condoms and avoid high-risk sexual practices to reduce the spread of sexually transmitted infections (STIs). STIs include gonorrhea, chlamydia, syphilis, trichomonas, herpes, HPV, and human immunodeficiency virus (HIV). Herpes, HIV, and HPV are viral illnesses that have no cure. They can result in disability, cancer, and death.  If you are at risk of being infected with HIV, it is recommended that you take a prescription medicine daily to prevent HIV infection. This is called preexposure prophylaxis (PrEP). You are considered at risk if:  You are a man who has sex with other men (MSM) and have other risk factors.  You are a heterosexual man, are sexually active, and are at increased risk for HIV infection.  You take drugs by injection.  You are sexually active with a partner who has HIV.  Talk with your health care provider about whether you are at high risk of being infected with HIV. If you choose to begin PrEP, you should first be tested for HIV. You should then be tested every 3 months for as long as you are taking PrEP.  A one-time screening for abdominal aortic aneurysm (AAA) and surgical repair of large AAAs by ultrasound are recommended for men ages 51 to 11 years who are current or former smokers.  Healthy men should no longer receive prostate-specific antigen (PSA) blood tests as part of routine cancer screening. Talk with your health care provider about prostate cancer screening.  Testicular cancer screening is  not recommended for adult males who have no symptoms. Screening includes self-exam, a health care provider exam, and other screening tests. Consult with your health care provider about any symptoms you have or any concerns you have about testicular cancer.  Use sunscreen. Apply sunscreen liberally and repeatedly throughout the day. You should seek shade when your shadow is shorter than you. Protect yourself by wearing long sleeves, pants, a wide-brimmed hat, and sunglasses year round, whenever you are outdoors.  Once a month, do a whole-body skin exam, using a mirror to look  at the skin on your back. Tell your health care provider about new moles, moles that have irregular borders, moles that are larger than a pencil eraser, or moles that have changed in shape or color.  Stay current with required vaccines (immunizations).  Influenza vaccine. All adults should be immunized every year.  Tetanus, diphtheria, and acellular pertussis (Td, Tdap) vaccine. An adult who has not previously received Tdap or who does not know his vaccine status should receive 1 dose of Tdap. This initial dose should be followed by tetanus and diphtheria toxoids (Td) booster doses every 10 years. Adults with an unknown or incomplete history of completing a 3-dose immunization series with Td-containing vaccines should begin or complete a primary immunization series including a Tdap dose. Adults should receive a Td booster every 10 years.  Varicella vaccine. An adult without evidence of immunity to varicella should receive 2 doses or a second dose if he has previously received 1 dose.  Human papillomavirus (HPV) vaccine. Males aged 58-21 years who have not received the vaccine previously should receive the 3-dose series. Males aged 22-26 years may be immunized. Immunization is recommended through the age of 40 years for any male who has sex with males and did not get any or all doses earlier. Immunization is recommended for any  person with an immunocompromised condition through the age of 22 years if he did not get any or all doses earlier. During the 3-dose series, the second dose should be obtained 4-8 weeks after the first dose. The third dose should be obtained 24 weeks after the first dose and 16 weeks after the second dose.  Zoster vaccine. One dose is recommended for adults aged 19 years or older unless certain conditions are present.  Measles, mumps, and rubella (MMR) vaccine. Adults born before [birth year] generally are considered immune to measles and mumps. Adults born in [birth year] or later should have 1 or more doses of MMR vaccine unless there is a contraindication to the vaccine or there is laboratory evidence of immunity to each of the three diseases. A routine second dose of MMR vaccine should be obtained at least 28 days after the first dose for students attending postsecondary schools, health care workers, or international travelers. People who received inactivated measles vaccine or an unknown type of measles vaccine during 1963-1967 should receive 2 doses of MMR vaccine. People who received inactivated mumps vaccine or an unknown type of mumps vaccine before 1979 and are at high risk for mumps infection should consider immunization with 2 doses of MMR vaccine. Unvaccinated health care workers born before [birth year] who lack laboratory evidence of measles, mumps, or rubella immunity or laboratory confirmation of disease should consider measles and mumps immunization with 2 doses of MMR vaccine or rubella immunization with 1 dose of MMR vaccine.  Pneumococcal 13-valent conjugate (PCV13) vaccine. When indicated, a person who is uncertain of his immunization history and has no record of immunization should receive the PCV13 vaccine. An adult aged 73 years or older who has certain medical conditions and has not been previously immunized should receive 1 dose of PCV13 vaccine. This PCV13 should be followed with a dose of pneumococcal  polysaccharide (PPSV23) vaccine. The PPSV23 vaccine dose should be obtained at least 8 weeks after the dose of PCV13 vaccine. An adult aged 23 years or older who has certain medical conditions and previously received 1 or more doses of PPSV23 vaccine should receive 1 dose of PCV13. The PCV13 vaccine dose should be obtained 1  or more years after the last PPSV23 vaccine dose.  Pneumococcal polysaccharide (PPSV23) vaccine. When PCV13 is also indicated, PCV13 should be obtained first. All adults aged 65 years and older should be immunized. An adult younger than age 65 years who has certain medical conditions should be immunized. Any person who resides in a nursing home or long-term care facility should be immunized. An adult smoker should be immunized. People with an immunocompromised condition and certain other conditions should receive both PCV13 and PPSV23 vaccines. People with human immunodeficiency virus (HIV) infection should be immunized as soon as possible after diagnosis. Immunization during chemotherapy or radiation therapy should be avoided. Routine use of PPSV23 vaccine is not recommended for American Indians, [place] Natives, or people younger than 65 years unless there are medical conditions that require PPSV23 vaccine. When indicated, people who have unknown immunization and have no record of immunization should receive PPSV23 vaccine. One-time revaccination 5 years after the first dose of PPSV23 is recommended for people aged 19-64 years who have chronic kidney failure, nephrotic syndrome, asplenia, or immunocompromised conditions. People who received 1-2 doses of PPSV23 before age 65 years should receive another dose of PPSV23 vaccine at age 65 years or later if at least 5 years have passed since the previous dose. Doses of PPSV23 are not needed for people immunized with PPSV23 at or after age 65 years.  Meningococcal vaccine. Adults with asplenia or persistent complement component deficiencies  should receive 2 doses of quadrivalent meningococcal conjugate (MenACWY-D) vaccine. The doses should be obtained at least 2 months apart. Microbiologists working with certain meningococcal bacteria, military recruits, people at risk during an outbreak, and people who travel to or live in countries with a high rate of meningitis should be immunized. A first-year college student up through age 21 years who is living in a residence hall should receive a dose if he did not receive a dose on or after his 16th birthday. Adults who have certain high-risk conditions should receive one or more doses of vaccine.  Hepatitis A vaccine. Adults who wish to be protected from this disease, have certain high-risk conditions, work with hepatitis A-infected animals, work in hepatitis A research labs, or travel to or work in countries with a high rate of hepatitis A should be immunized. Adults who were previously unvaccinated and who anticipate close contact with an international adoptee during the first 60 days after arrival in the [place] from a country with a high rate of hepatitis A should be immunized.  Hepatitis B vaccine. Adults should be immunized if they wish to be protected from this disease, have certain high-risk conditions, may be exposed to blood or other infectious body fluids, are household contacts or sex partners of hepatitis B positive people, are clients or workers in certain care facilities, or travel to or work in countries with a high rate of hepatitis B.  Haemophilus influenzae type b (Hib) vaccine. A previously unvaccinated person with asplenia or sickle cell disease or having a scheduled splenectomy should receive 1 dose of Hib vaccine. Regardless of previous immunization, a recipient of a hematopoietic stem cell transplant should receive a 3-dose series 6-12 months after his successful transplant. Hib vaccine is not recommended for adults with HIV infection. Preventive Service / Frequency Ages  40 to 64  Blood pressure check.** / Every 1 to 2 years.  Lipid and cholesterol check.** / Every 5 years beginning at age 20.  Lung cancer screening. / Every year if you are aged 55-80   55-80 years and have a 30-pack-year history of smoking and currently smoke or have quit within the past 15 years. Yearly screening is stopped once you have quit smoking for at least 15 years or develop a health problem that would prevent you from having lung cancer treatment.  Fecal occult blood test (FOBT) of stool. / Every year beginning at age 50 and continuing until age 13. You may not have to do this test if you get a colonoscopy every 10 years.  Flexible sigmoidoscopy** or colonoscopy.** / Every 5 years for a flexible sigmoidoscopy or every 10 years for a colonoscopy beginning at age 62 and continuing until age 83.  Hepatitis C blood test.** / For all people born from [birth year] through 1965 and any individual with known risks for hepatitis C.  Skin self-exam. / Monthly.  Influenza vaccine. / Every year.  Tetanus, diphtheria, and acellular pertussis (Tdap/Td) vaccine.** / Consult your health care provider. 1 dose of Td every 10 years.  Varicella vaccine.** / Consult your health care provider.  Zoster vaccine.** / 1 dose for adults aged 24 years or older.  Measles, mumps, rubella (MMR) vaccine.** / You need at least 1 dose of MMR if you were born in 1957 or later. You may also need a second dose.  Pneumococcal 13-valent conjugate (PCV13) vaccine.** / Consult your health care provider.  Pneumococcal polysaccharide (PPSV23) vaccine.** / 1 to 2 doses if you smoke cigarettes or if you have certain conditions.  Meningococcal vaccine.** / Consult your health care provider.  Hepatitis A vaccine.** / Consult your health care provider.  Hepatitis B vaccine.** / Consult your health care provider.  Haemophilus influenzae type b (Hib) vaccine.** / Consult your health care provider.

## 2014-05-07 NOTE — Progress Notes (Signed)
Patient ID: Frank Cruz, male   DOB: 1961-04-28, 53 y.o.   MRN: [id number]  Annual Screening Comprehensive Examination  This very nice 53 y.o. presents for complete physical.  Patient has recently been seen about 6 and 8 weeks ago for LBP and left sciatica and was treated with 2 courses of steroids with improvement , but not resolution. He reports the pain is significantly improved and the only activity which exacerbates the pain is sitting. He is able to walk, lay down bend over w/o significant discomfort. He states he has sufficiently improved that he would not consider MRI scan or EDSI, but would like to try one more course of steroids.   The patient is screened for elevated BP expectantly because of family Hx of HTN and Cardiovascular Dz.      . Patient's BP has been controlled and today's BP is 102/78 mmHg. Patient denies any cardiac symptoms as chest pain, palpitations, shortness of breath, dizziness or ankle swelling.   Patient's has hx/o mild hyperlipidemia which he has attempted to control with diet. Last lipids were Chol 178, TG 139, HDL 37 and LDL 113 in Oct 2013.    Patient has hx of borderline glucose and patient denies reactive hypoglycemic symptoms, visual blurring, diabetic polys or paresthesias. Last A1c was 5.3% in May 2013.      Finally, patient has history of Vitamin D Deficiency and last vitamin D was 50  In Oct 2013.   Medication Sig  . gabapentin 300 MG cap Take 1 or 2 capsules 1 hour before bedtime for nerve pain   No Known Allergies  Past Medical History  Diagnosis Date  . Allergy   . Hyperlipidemia    FHx (=) for HTN, T2DM, ASCVD/CVAin Father.  History   Social History  . Marital Status: Divorced  . Years of Education: N/A   Occupational History  . 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   Social History Main Topics  . Smoking status: Current Some Day Smoker    Types: Cigarettes  . Smokeless tobacco: Not on file  . Alcohol Use: 12.0 oz/week   20 Cans of beer per week  . Drug Use: No  . Sexual Activity: Not on file    ROS Constitutional: Denies fever, chills, weight loss/gain, headaches, insomnia, fatigue, night sweats or change in appetite. Eyes: Denies redness, blurred vision, diplopia, discharge, itchy or watery eyes.  ENT: Denies discharge, congestion, post nasal drip, epistaxis, sore throat, earache, hearing loss, dental pain, Tinnitus, Vertigo, Sinus pain or snoring.  Cardio: Denies chest pain, palpitations, irregular heartbeat, syncope, dyspnea, diaphoresis, orthopnea, PND, claudication or edema Respiratory: denies cough, dyspnea, DOE, pleurisy, hoarseness, laryngitis or wheezing.  Gastrointestinal: Denies dysphagia, heartburn, reflux, water brash, pain, cramps, nausea, vomiting, bloating, diarrhea, constipation, hematemesis, melena, hematochezia, jaundice or hemorrhoids Genitourinary: Denies dysuria, frequency, urgency, nocturia, hesitancy, discharge, hematuria or flank pain Musculoskeletal: Denies arthralgia, myalgia, stiffness, Jt. Swelling, pain, limp or strain/sprain. Denies Falls. Skin: Denies puritis, rash, hives, warts, acne, eczema or change in skin lesion Neuro: No weakness, tremor, incoordination, spasms, paresthesia or pain Psychiatric: Denies confusion, memory loss or sensory loss. Denies Depression. Endocrine: Denies change in weight, skin, hair change, nocturia, and paresthesia, diabetic polys, visual blurring or hyper / hypo glycemic episodes.  Heme/Lymph: No excessive bleeding, bruising or enlarged lymph nodes.   Physical Exam  BP 102/78  Pulse 68  Temp 97.5 F   Resp 16  Ht 5' 11.25"   Wt 175 lb 8  oz   BMI 24.30 kg/m2  General Appearance: Well nourished, in no apparent distress. Eyes: PERRLA, EOMs, conjunctiva no swelling or erythema, normal fundi and vessels. Sinuses: No frontal/maxillary tenderness ENT/Mouth: EACs patent / TMs  nl. Nares clear without erythema, swelling, mucoid exudates. Oral  hygiene is good. No erythema, swelling, or exudate. Tongue normal, non-obstructing. Tonsils not swollen or erythematous. Hearing normal.  Neck: Supple, thyroid normal. No bruits, nodes or JVD. Respiratory: Respiratory effort normal.  BS equal and clear bilateral without rales, rhonci, wheezing or stridor. Cardio: Heart sounds are normal with regular rate and rhythm and no murmurs, rubs or gallops. Peripheral pulses are normal and equal bilaterally without edema. No aortic or femoral bruits. Chest: symmetric with normal excursions and percussion.  Abdomen: Flat, soft, with bowl sounds. Nontender, no guarding, rebound, hernias, masses, or organomegaly.  Lymphatics: Non tender without lymphadenopathy.  Genitourinary: No hernias.Testes nl. DRE - prostate nl for age - smooth & firm w/o nodules. Musculoskeletal: Full ROM all peripheral extremities, joint stability, 5/5 strength, and normal gait. Skin: Warm and dry without rashes, lesions, cyanosis, clubbing or  ecchymosis.  Neuro: Cranial nerves intact, reflexes equal bilaterally. Normal muscle tone, no cerebellar symptoms. Sensation intact. Negative SLR bilateral. Pysch: Awake and oriented X 3with normal affect, insight and judgment appropriate.  Assessment and Plan  1. Annual Screening Examination 2. Elevated BP Screening  3. Hyperlipidemia 4. Hx/o Abn Glucose 5. Vitamin D Deficiency 6. Lumbago /Lt Sciatica   Continue prudent diet as discussed, weight control, BP monitoring, regular exercise, and medications as discussed.  Discussed med effects and SE's. Routine screening labs and tests as requested with regular follow-up as recommended.   Will try another course of steroids for his Lumbago.

## 2014-05-08 LAB — URINALYSIS, MICROSCOPIC ONLY
BACTERIA UA: NONE SEEN
CRYSTALS: NONE SEEN
Casts: NONE SEEN
Squamous Epithelial / LPF: NONE SEEN

## 2014-05-08 LAB — INSULIN, FASTING: Insulin fasting, serum: 4 u[IU]/mL (ref 3–28)

## 2014-05-08 LAB — BASIC METABOLIC PANEL WITH GFR
BUN: 18 mg/dL (ref 6–23)
CALCIUM: 9.4 mg/dL (ref 8.4–10.5)
CO2: 26 mEq/L (ref 19–32)
CREATININE: 1.15 mg/dL (ref 0.50–1.35)
Chloride: 101 mEq/L (ref 96–112)
GFR, EST AFRICAN AMERICAN: 84 mL/min
GFR, EST NON AFRICAN AMERICAN: 72 mL/min
Glucose, Bld: 92 mg/dL (ref 70–99)
Potassium: 4.1 mEq/L (ref 3.5–5.3)
SODIUM: 138 meq/L (ref 135–145)

## 2014-05-08 LAB — MICROALBUMIN / CREATININE URINE RATIO
Creatinine, Urine: 171.3 mg/dL
MICROALB UR: 0.61 mg/dL (ref 0.00–1.89)
MICROALB/CREAT RATIO: 3.6 mg/g (ref 0.0–30.0)

## 2014-05-08 LAB — RPR

## 2014-05-08 LAB — HEPATIC FUNCTION PANEL
ALBUMIN: 4.7 g/dL (ref 3.5–5.2)
ALT: 23 U/L (ref 0–53)
AST: 23 U/L (ref 0–37)
Alkaline Phosphatase: 39 U/L (ref 39–117)
BILIRUBIN TOTAL: 0.3 mg/dL (ref 0.2–1.2)
Bilirubin, Direct: 0.1 mg/dL (ref 0.0–0.3)
Indirect Bilirubin: 0.2 mg/dL (ref 0.2–1.2)
Total Protein: 7 g/dL (ref 6.0–8.3)

## 2014-05-08 LAB — PSA: PSA: 0.62 ng/mL (ref <=4.00)

## 2014-05-08 LAB — LIPID PANEL
CHOLESTEROL: 244 mg/dL — AB (ref 0–200)
HDL: 71 mg/dL (ref >39)
LDL Cholesterol: 151 mg/dL — ABNORMAL HIGH (ref 0–99)
TRIGLYCERIDES: 112 mg/dL (ref <150)
Total CHOL/HDL Ratio: 3.4 Ratio
VLDL: 22 mg/dL (ref 0–40)

## 2014-05-08 LAB — HEPATITIS C ANTIBODY: HCV AB: NEGATIVE

## 2014-05-08 LAB — VITAMIN B12: Vitamin B-12: 626 pg/mL (ref 211–911)

## 2014-05-08 LAB — VITAMIN D 25 HYDROXY (VIT D DEFICIENCY, FRACTURES): Vit D, 25-Hydroxy: 39 ng/mL (ref 30–89)

## 2014-05-08 LAB — HIV ANTIBODY (ROUTINE TESTING W REFLEX): HIV: NONREACTIVE

## 2014-05-08 LAB — TESTOSTERONE: Testosterone: 449 ng/dL (ref 300–890)

## 2014-05-08 LAB — MAGNESIUM: MAGNESIUM: 2.2 mg/dL (ref 1.5–2.5)

## 2014-05-08 LAB — HEMOGLOBIN A1C
HEMOGLOBIN A1C: 5.7 % — AB (ref <5.7)
MEAN PLASMA GLUCOSE: 117 mg/dL — AB (ref <117)

## 2014-05-08 LAB — HEPATITIS B CORE ANTIBODY, TOTAL: Hep B Core Total Ab: NONREACTIVE

## 2014-05-08 LAB — HEPATITIS A ANTIBODY, TOTAL: HEP A TOTAL AB: NONREACTIVE

## 2014-05-08 LAB — TSH: TSH: 3.349 u[IU]/mL (ref 0.350–4.500)

## 2014-05-08 LAB — HEPATITIS B SURFACE ANTIBODY,QUALITATIVE: HEP B S AB: POSITIVE — AB

## 2014-05-12 LAB — HEPATITIS B E ANTIBODY: Hepatitis Be Antibody: NONREACTIVE

## 2014-06-16 ENCOUNTER — Other Ambulatory Visit: Payer: Self-pay

## 2014-06-16 DIAGNOSIS — M549 Dorsalgia, unspecified: Secondary | ICD-10-CM

## 2014-06-17 ENCOUNTER — Telehealth: Payer: Self-pay | Admitting: *Deleted

## 2014-06-17 NOTE — Telephone Encounter (Signed)
Patient called and states he is having back pain again and asked advise about what to do.  Per Dr Melford Aase, refer patient to Dr Lorin Mercy to evaluate pain.  Appointment with Dr Lorin Mercy scheduled for today at 1:00 and patient aware.

## 2014-06-19 ENCOUNTER — Other Ambulatory Visit: Payer: Self-pay | Admitting: Orthopaedic Surgery

## 2014-06-19 DIAGNOSIS — M5442 Lumbago with sciatica, left side: Secondary | ICD-10-CM

## 2014-06-22 ENCOUNTER — Ambulatory Visit
Admission: RE | Admit: 2014-06-22 | Discharge: 2014-06-22 | Disposition: A | Discharge status: Home / Self Care | Payer: BC Managed Care – PPO | Source: Ambulatory Visit | Attending: Orthopaedic Surgery | Admitting: Orthopaedic Surgery

## 2014-06-22 DIAGNOSIS — M5442 Lumbago with sciatica, left side: Secondary | ICD-10-CM

## 2014-06-24 ENCOUNTER — Other Ambulatory Visit: Payer: Self-pay | Admitting: Neurosurgery

## 2014-06-24 ENCOUNTER — Encounter (HOSPITAL_COMMUNITY): Payer: Self-pay | Admitting: Pharmacy Technician

## 2014-06-24 ENCOUNTER — Encounter (HOSPITAL_COMMUNITY): Payer: Self-pay | Admitting: *Deleted

## 2014-06-25 ENCOUNTER — Encounter (HOSPITAL_COMMUNITY): Payer: BC Managed Care – PPO | Admitting: Anesthesiology

## 2014-06-25 ENCOUNTER — Ambulatory Visit (HOSPITAL_COMMUNITY)
Admission: RE | Admit: 2014-06-25 | Discharge: 2014-06-25 | Disposition: A | Discharge status: Home / Self Care | Payer: BC Managed Care – PPO | Source: Ambulatory Visit | Attending: Neurosurgery | Admitting: Neurosurgery

## 2014-06-25 ENCOUNTER — Encounter (HOSPITAL_COMMUNITY)
Admission: RE | Disposition: A | Discharge status: Home / Self Care | Payer: Self-pay | Source: Ambulatory Visit | Attending: Neurosurgery

## 2014-06-25 ENCOUNTER — Ambulatory Visit (HOSPITAL_COMMUNITY): Payer: BC Managed Care – PPO | Admitting: Anesthesiology

## 2014-06-25 ENCOUNTER — Ambulatory Visit (HOSPITAL_COMMUNITY): Payer: BC Managed Care – PPO

## 2014-06-25 ENCOUNTER — Encounter (HOSPITAL_COMMUNITY): Payer: Self-pay | Admitting: *Deleted

## 2014-06-25 DIAGNOSIS — M5116 Intervertebral disc disorders with radiculopathy, lumbar region: Secondary | ICD-10-CM | POA: Diagnosis present

## 2014-06-25 DIAGNOSIS — M5126 Other intervertebral disc displacement, lumbar region: Secondary | ICD-10-CM | POA: Diagnosis present

## 2014-06-25 DIAGNOSIS — M5117 Intervertebral disc disorders with radiculopathy, lumbosacral region: Secondary | ICD-10-CM | POA: Diagnosis not present

## 2014-06-25 DIAGNOSIS — E559 Vitamin D deficiency, unspecified: Secondary | ICD-10-CM | POA: Insufficient documentation

## 2014-06-25 DIAGNOSIS — Z87891 Personal history of nicotine dependence: Secondary | ICD-10-CM | POA: Insufficient documentation

## 2014-06-25 DIAGNOSIS — Z86718 Personal history of other venous thrombosis and embolism: Secondary | ICD-10-CM | POA: Insufficient documentation

## 2014-06-25 DIAGNOSIS — E785 Hyperlipidemia, unspecified: Secondary | ICD-10-CM | POA: Insufficient documentation

## 2014-06-25 HISTORY — DX: Acute embolism and thrombosis of unspecified deep veins of unspecified lower extremity: I82.409

## 2014-06-25 HISTORY — DX: Low back pain, unspecified: M54.50

## 2014-06-25 HISTORY — DX: Low back pain: M54.5

## 2014-06-25 HISTORY — PX: LUMBAR LAMINECTOMY/DECOMPRESSION MICRODISCECTOMY: SHX5026

## 2014-06-25 HISTORY — DX: Vitamin D deficiency, unspecified: E55.9

## 2014-06-25 LAB — CBC
HCT: 44.4 % (ref 39.0–52.0)
HEMOGLOBIN: 15.2 g/dL (ref 13.0–17.0)
MCH: 31.4 pg (ref 26.0–34.0)
MCHC: 34.2 g/dL (ref 30.0–36.0)
MCV: 91.7 fL (ref 78.0–100.0)
PLATELETS: 128 10*3/uL — AB (ref 150–400)
RBC: 4.84 MIL/uL (ref 4.22–5.81)
RDW: 13 % (ref 11.5–15.5)
WBC: 3.9 10*3/uL — ABNORMAL LOW (ref 4.0–10.5)

## 2014-06-25 LAB — BASIC METABOLIC PANEL
ANION GAP: 7 (ref 5–15)
BUN: 15 mg/dL (ref 6–23)
CALCIUM: 9.3 mg/dL (ref 8.4–10.5)
CHLORIDE: 101 meq/L (ref 96–112)
CO2: 31 mEq/L (ref 19–32)
CREATININE: 1.06 mg/dL (ref 0.50–1.35)
GFR, EST NON AFRICAN AMERICAN: 78 mL/min — AB (ref >90)
Glucose, Bld: 92 mg/dL (ref 70–99)
Potassium: 4.7 mEq/L (ref 3.7–5.3)
Sodium: 139 mEq/L (ref 137–147)

## 2014-06-25 LAB — SURGICAL PCR SCREEN
MRSA, PCR: NEGATIVE
STAPHYLOCOCCUS AUREUS: NEGATIVE

## 2014-06-25 SURGERY — LUMBAR LAMINECTOMY/DECOMPRESSION MICRODISCECTOMY 1 LEVEL
Anesthesia: General | Site: Back | Laterality: Left

## 2014-06-25 MED ORDER — SODIUM CHLORIDE 0.9 % IJ SOLN: 3.0000 mL | Freq: Two times a day (BID) | INTRAMUSCULAR | Status: DC

## 2014-06-25 MED ORDER — BUPIVACAINE HCL (PF) 0.25 % IJ SOLN
INTRAMUSCULAR | Status: DC | PRN
  Administered 2014-06-25: 15 mL

## 2014-06-25 MED ORDER — CEFAZOLIN SODIUM-DEXTROSE 2-3 GM-% IV SOLR
INTRAVENOUS | Status: AC
  Filled 2014-06-25: qty 50

## 2014-06-25 MED ORDER — HYDROMORPHONE HCL 1 MG/ML IJ SOLN: 0.2500 mg | INTRAMUSCULAR | Status: DC | PRN

## 2014-06-25 MED ORDER — ROCURONIUM BROMIDE 100 MG/10ML IV SOLN
INTRAVENOUS | Status: DC | PRN
  Administered 2014-06-25: 40 mg via INTRAVENOUS

## 2014-06-25 MED ORDER — CYCLOBENZAPRINE HCL 10 MG PO TABS: 10.0000 mg | ORAL_TABLET | Freq: Three times a day (TID) | ORAL | Status: DC | PRN

## 2014-06-25 MED ORDER — MENTHOL 3 MG MT LOZG
1.0000 | LOZENGE | OROMUCOSAL | Status: DC | PRN
Start: 2014-06-25 — End: 2014-06-25

## 2014-06-25 MED ORDER — LACTATED RINGERS IV SOLN
INTRAVENOUS | Status: DC | PRN
  Administered 2014-06-25: 08:00:00 via INTRAVENOUS

## 2014-06-25 MED ORDER — SENNA 8.6 MG PO TABS
1.0000 | ORAL_TABLET | Freq: Two times a day (BID) | ORAL | Status: DC
  Administered 2014-06-25: 8.6 mg via ORAL
  Filled 2014-06-25: qty 1

## 2014-06-25 MED ORDER — 0.9 % SODIUM CHLORIDE (POUR BTL) OPTIME
TOPICAL | Status: DC | PRN
  Administered 2014-06-25: 1000 mL

## 2014-06-25 MED ORDER — CEFAZOLIN SODIUM-DEXTROSE 2-3 GM-% IV SOLR
INTRAVENOUS | Status: DC | PRN
  Administered 2014-06-25: 2 g via INTRAVENOUS

## 2014-06-25 MED ORDER — FENTANYL CITRATE 0.05 MG/ML IJ SOLN
INTRAMUSCULAR | Status: DC | PRN
  Administered 2014-06-25: 150 ug via INTRAVENOUS

## 2014-06-25 MED ORDER — MIDAZOLAM HCL 2 MG/2ML IJ SOLN
INTRAMUSCULAR | Status: AC
  Filled 2014-06-25: qty 2

## 2014-06-25 MED ORDER — OXYCODONE HCL 5 MG/5ML PO SOLN: 5.0000 mg | Freq: Once | ORAL | Status: DC | PRN

## 2014-06-25 MED ORDER — GLYCOPYRROLATE 0.2 MG/ML IJ SOLN
INTRAMUSCULAR | Status: DC | PRN
  Administered 2014-06-25: 0.2 mg via INTRAVENOUS

## 2014-06-25 MED ORDER — PHENOL 1.4 % MT LIQD: 1.0000 | OROMUCOSAL | Status: DC | PRN

## 2014-06-25 MED ORDER — OXYCODONE HCL 5 MG PO TABS: 5.0000 mg | ORAL_TABLET | Freq: Once | ORAL | Status: DC | PRN

## 2014-06-25 MED ORDER — PROPOFOL 10 MG/ML IV BOLUS
INTRAVENOUS | Status: AC
  Filled 2014-06-25: qty 20

## 2014-06-25 MED ORDER — DEXAMETHASONE SODIUM PHOSPHATE 10 MG/ML IJ SOLN
INTRAMUSCULAR | Status: AC
  Filled 2014-06-25: qty 1

## 2014-06-25 MED ORDER — MUPIROCIN 2 % EX OINT
1.0000 "application " | TOPICAL_OINTMENT | Freq: Once | CUTANEOUS | Status: AC
  Administered 2014-06-25: 1 via TOPICAL
  Filled 2014-06-25: qty 22

## 2014-06-25 MED ORDER — MIDAZOLAM HCL 5 MG/5ML IJ SOLN
INTRAMUSCULAR | Status: DC | PRN
  Administered 2014-06-25: 2 mg via INTRAVENOUS

## 2014-06-25 MED ORDER — PROPOFOL 10 MG/ML IV BOLUS
INTRAVENOUS | Status: DC | PRN
  Administered 2014-06-25: 200 mg via INTRAVENOUS

## 2014-06-25 MED ORDER — KETOROLAC TROMETHAMINE 30 MG/ML IJ SOLN
INTRAMUSCULAR | Status: DC | PRN
  Administered 2014-06-25: 30 mg via INTRAVENOUS

## 2014-06-25 MED ORDER — DEXAMETHASONE SODIUM PHOSPHATE 10 MG/ML IJ SOLN
INTRAMUSCULAR | Status: DC | PRN
  Administered 2014-06-25: 10 mg via INTRAVENOUS

## 2014-06-25 MED ORDER — OXYCODONE-ACETAMINOPHEN 5-325 MG PO TABS
1.0000 | ORAL_TABLET | ORAL | Status: DC | PRN
  Administered 2014-06-25: 2 via ORAL
  Filled 2014-06-25: qty 2

## 2014-06-25 MED ORDER — FENTANYL CITRATE 0.05 MG/ML IJ SOLN
INTRAMUSCULAR | Status: AC
  Filled 2014-06-25: qty 5

## 2014-06-25 MED ORDER — ACETAMINOPHEN 325 MG PO TABS: 650.0000 mg | ORAL_TABLET | ORAL | Status: DC | PRN

## 2014-06-25 MED ORDER — ACETAMINOPHEN 650 MG RE SUPP: 650.0000 mg | RECTAL | Status: DC | PRN

## 2014-06-25 MED ORDER — LIDOCAINE HCL (CARDIAC) 20 MG/ML IV SOLN
INTRAVENOUS | Status: DC | PRN
  Administered 2014-06-25: 30 mg via INTRAVENOUS

## 2014-06-25 MED ORDER — SODIUM CHLORIDE 0.9 % IJ SOLN: 3.0000 mL | INTRAMUSCULAR | Status: DC | PRN

## 2014-06-25 MED ORDER — NEOSTIGMINE METHYLSULFATE 10 MG/10ML IV SOLN
INTRAVENOUS | Status: DC | PRN
  Administered 2014-06-25: 2 mg via INTRAVENOUS

## 2014-06-25 MED ORDER — ARTIFICIAL TEARS OP OINT
TOPICAL_OINTMENT | OPHTHALMIC | Status: DC | PRN
  Administered 2014-06-25: 1 via OPHTHALMIC

## 2014-06-25 MED ORDER — SCOPOLAMINE 1 MG/3DAYS TD PT72
MEDICATED_PATCH | TRANSDERMAL | Status: DC | PRN
  Administered 2014-06-25: 1 via TRANSDERMAL

## 2014-06-25 MED ORDER — KETOROLAC TROMETHAMINE 30 MG/ML IJ SOLN: 30.0000 mg | Freq: Four times a day (QID) | INTRAMUSCULAR | Status: DC

## 2014-06-25 MED ORDER — HYDROMORPHONE HCL 1 MG/ML IJ SOLN: 0.5000 mg | INTRAMUSCULAR | Status: DC | PRN

## 2014-06-25 MED ORDER — ALUM & MAG HYDROXIDE-SIMETH 200-200-20 MG/5ML PO SUSP: 30.0000 mL | Freq: Four times a day (QID) | ORAL | Status: DC | PRN

## 2014-06-25 MED ORDER — CEFAZOLIN SODIUM 1-5 GM-% IV SOLN
1.0000 g | Freq: Three times a day (TID) | INTRAVENOUS | Status: DC
  Filled 2014-06-25 (×2): qty 50

## 2014-06-25 MED ORDER — SURGIFOAM 100 EX MISC
CUTANEOUS | Status: DC | PRN
  Administered 2014-06-25: 09:00:00 via TOPICAL

## 2014-06-25 MED ORDER — ONDANSETRON HCL 4 MG/2ML IJ SOLN
INTRAMUSCULAR | Status: DC | PRN
  Administered 2014-06-25: 4 mg via INTRAVENOUS

## 2014-06-25 MED ORDER — PROMETHAZINE HCL 25 MG/ML IJ SOLN: 6.2500 mg | INTRAMUSCULAR | Status: DC | PRN

## 2014-06-25 MED ORDER — OXYCODONE-ACETAMINOPHEN 5-325 MG PO TABS: 1.0000 | ORAL_TABLET | ORAL | Status: DC | PRN

## 2014-06-25 MED ORDER — SCOPOLAMINE 1 MG/3DAYS TD PT72
MEDICATED_PATCH | TRANSDERMAL | Status: AC
  Filled 2014-06-25: qty 1

## 2014-06-25 MED ORDER — HYDROCODONE-ACETAMINOPHEN 5-325 MG PO TABS
1.0000 | ORAL_TABLET | ORAL | Status: DC | PRN
Start: 2014-06-25 — End: 2014-06-25

## 2014-06-25 MED ORDER — ONDANSETRON HCL 4 MG/2ML IJ SOLN: 4.0000 mg | INTRAMUSCULAR | Status: DC | PRN

## 2014-06-25 MED ORDER — SODIUM CHLORIDE 0.9 % IR SOLN
Status: DC | PRN
  Administered 2014-06-25: 09:00:00

## 2014-06-25 MED ORDER — SODIUM CHLORIDE 0.9 % IV SOLN: 250.0000 mL | INTRAVENOUS | Status: DC

## 2014-06-25 SURGICAL SUPPLY — 47 items
BAG DECANTER FOR FLEXI CONT (MISCELLANEOUS) ×3 IMPLANT
BENZOIN TINCTURE PRP APPL 2/3 (GAUZE/BANDAGES/DRESSINGS) ×3 IMPLANT
BLADE CLIPPER SURG (BLADE) IMPLANT
BRUSH SCRUB EZ PLAIN DRY (MISCELLANEOUS) ×3 IMPLANT
BUR CUTTER 7.0 ROUND (BURR) ×3 IMPLANT
CANISTER SUCT 3000ML (MISCELLANEOUS) ×3 IMPLANT
CLOSURE WOUND 1/2 X4 (GAUZE/BANDAGES/DRESSINGS) ×1
CONT SPEC 4OZ CLIKSEAL STRL BL (MISCELLANEOUS) ×3 IMPLANT
DECANTER SPIKE VIAL GLASS SM (MISCELLANEOUS) ×3 IMPLANT
DERMABOND ADVANCED (GAUZE/BANDAGES/DRESSINGS) ×2
DERMABOND ADVANCED .7 DNX12 (GAUZE/BANDAGES/DRESSINGS) ×1 IMPLANT
DRAPE LAPAROTOMY 100X72X124 (DRAPES) ×3 IMPLANT
DRAPE MICROSCOPE LEICA (MISCELLANEOUS) ×3 IMPLANT
DRAPE POUCH INSTRU U-SHP 10X18 (DRAPES) ×3 IMPLANT
DRAPE PROXIMA HALF (DRAPES) IMPLANT
DRAPE SURG 17X23 STRL (DRAPES) ×6 IMPLANT
DURAPREP 26ML APPLICATOR (WOUND CARE) ×3 IMPLANT
ELECT REM PT RETURN 9FT ADLT (ELECTROSURGICAL) ×3
ELECTRODE REM PT RTRN 9FT ADLT (ELECTROSURGICAL) ×1 IMPLANT
GAUZE SPONGE 4X4 12PLY STRL (GAUZE/BANDAGES/DRESSINGS) ×3 IMPLANT
GAUZE SPONGE 4X4 16PLY XRAY LF (GAUZE/BANDAGES/DRESSINGS) IMPLANT
GLOVE ECLIPSE 9.0 STRL (GLOVE) ×3 IMPLANT
GLOVE EXAM NITRILE LRG STRL (GLOVE) IMPLANT
GLOVE EXAM NITRILE MD LF STRL (GLOVE) IMPLANT
GLOVE EXAM NITRILE XL STR (GLOVE) IMPLANT
GLOVE EXAM NITRILE XS STR PU (GLOVE) IMPLANT
GOWN STRL REUS W/ TWL LRG LVL3 (GOWN DISPOSABLE) IMPLANT
GOWN STRL REUS W/ TWL XL LVL3 (GOWN DISPOSABLE) ×1 IMPLANT
GOWN STRL REUS W/TWL 2XL LVL3 (GOWN DISPOSABLE) IMPLANT
GOWN STRL REUS W/TWL LRG LVL3 (GOWN DISPOSABLE)
GOWN STRL REUS W/TWL XL LVL3 (GOWN DISPOSABLE) ×2
KIT BASIN OR (CUSTOM PROCEDURE TRAY) ×3 IMPLANT
KIT ROOM TURNOVER OR (KITS) ×3 IMPLANT
NEEDLE HYPO 22GX1.5 SAFETY (NEEDLE) ×3 IMPLANT
NEEDLE SPNL 22GX3.5 QUINCKE BK (NEEDLE) ×3 IMPLANT
NS IRRIG 1000ML POUR BTL (IV SOLUTION) ×3 IMPLANT
PACK LAMINECTOMY NEURO (CUSTOM PROCEDURE TRAY) ×3 IMPLANT
PAD ARMBOARD 7.5X6 YLW CONV (MISCELLANEOUS) ×9 IMPLANT
RUBBERBAND STERILE (MISCELLANEOUS) ×6 IMPLANT
SPONGE SURGIFOAM ABS GEL SZ50 (HEMOSTASIS) ×3 IMPLANT
STRIP CLOSURE SKIN 1/2X4 (GAUZE/BANDAGES/DRESSINGS) ×2 IMPLANT
SUT VIC AB 2-0 CT1 18 (SUTURE) ×3 IMPLANT
SUT VIC AB 3-0 SH 8-18 (SUTURE) ×3 IMPLANT
SYR 20ML ECCENTRIC (SYRINGE) ×3 IMPLANT
TOWEL OR 17X24 6PK STRL BLUE (TOWEL DISPOSABLE) ×3 IMPLANT
TOWEL OR 17X26 10 PK STRL BLUE (TOWEL DISPOSABLE) ×3 IMPLANT
WATER STERILE IRR 1000ML POUR (IV SOLUTION) ×3 IMPLANT

## 2014-06-25 NOTE — H&P (Signed)
Frank Cruz is an 53 y.o. male.   Chief Complaint: Left leg pain HPI: 53 year old male with severe left lower extremity pain consistent with a left-sided S1 radiculopathy and failed conservative management. Workup demonstrates evidence of very large left-sided L5-S1 disc herniation with compression of the thecal sac and left S1 nerve root. Patient presents now for microdiscectomy in hopes of improving his symptoms.  Past Medical History  Diagnosis Date  . Allergy   . Hyperlipidemia   . Lumbago   . Vitamin D deficiency   . DVT (deep venous thrombosis) 2013ish    left     Past Surgical History  Procedure Laterality Date  . Wisdom tooth extraction      History reviewed. No pertinent family history. Social History:  reports that he has quit smoking. His smoking use included Cigarettes. He smoked 0.00 packs per day for 0 years. He does not have any smokeless tobacco history on file. He reports that he drinks about 3.6 ounces of alcohol per week. He reports that he does not use illicit drugs.  Allergies: No Known Allergies  Medications Prior to Admission  Medication Sig Dispense Refill  . oxyCODONE-acetaminophen (PERCOCET/ROXICET) 5-325 MG per tablet Take 2-3 tablets by mouth every 4 (four) hours as needed for severe pain.         Results for orders placed during the hospital encounter of 06/25/14 (from the past 48 hour(s))  CBC     Status: Abnormal   Collection Time    06/25/14  7:22 AM      Result Value Ref Range   WBC 3.9 (*) 4.0 - 10.5 K/uL   RBC 4.84  4.22 - 5.81 MIL/uL   Hemoglobin 15.2  13.0 - 17.0 g/dL   HCT 44.4  39.0 - 52.0 %   MCV 91.7  78.0 - 100.0 fL   MCH 31.4  26.0 - 34.0 pg   MCHC 34.2  30.0 - 36.0 g/dL   RDW 13.0  11.5 - 15.5 %   Platelets 128 (*) 150 - 400 K/uL   No results found.  Review of Systems  Constitutional: Negative.   HENT: Negative.   Eyes: Negative.   Respiratory: Negative.   Cardiovascular: Negative.   Gastrointestinal: Negative.    Genitourinary: Negative.   Musculoskeletal: Negative.   Skin: Negative.   Neurological: Negative.   Endo/Heme/Allergies: Negative.   Psychiatric/Behavioral: Negative.     Blood pressure 97/63, pulse 57, temperature 97.9 F (36.6 C), temperature source Oral, resp. rate 20, height 5' 11.5" (1.816 m), weight 79.379 kg (175 lb), SpO2 99.00%. Physical Exam  Constitutional: He is oriented to person, place, and time. He appears well-developed and well-nourished. No distress.  HENT:  Head: Normocephalic and atraumatic.  Right Ear: External ear normal.  Left Ear: External ear normal.  Nose: Nose normal.  Mouth/Throat: Oropharynx is clear and moist. No oropharyngeal exudate.  Eyes: Conjunctivae and EOM are normal. Pupils are equal, round, and reactive to light. Right eye exhibits no discharge. Left eye exhibits no discharge. No scleral icterus.  Neck: Normal range of motion. Neck supple. No JVD present. No tracheal deviation present. No thyromegaly present.  Cardiovascular: Normal rate, regular rhythm, normal heart sounds and intact distal pulses.  Exam reveals no friction rub.   No murmur heard. Respiratory: Effort normal and breath sounds normal. No respiratory distress. He has no wheezes.  GI: Soft. Bowel sounds are normal. He exhibits no distension. There is no tenderness.  Musculoskeletal: Normal range of motion. He exhibits  no edema and no tenderness.  Neurological: He is alert and oriented to person, place, and time. He has normal reflexes. He displays normal reflexes. No cranial nerve deficit. He exhibits normal muscle tone. Coordination normal.  Skin: Skin is warm and dry. No rash noted. He is not diaphoretic. No erythema. No pallor.  Psychiatric: He has a normal mood and affect. His behavior is normal. Judgment and thought content normal.     Assessment/Plan Left L5-S1 herniated nucleus pulposus with radiculopathy. Plan left L5-S1 laminotomy and microdiscectomy. Risks and benefits  been explained. Patient wishes to proceed.  Effa Yarrow A 06/25/2014, 8:00 AM

## 2014-06-25 NOTE — Anesthesia Preprocedure Evaluation (Addendum)
Anesthesia Evaluation  Patient identified by MRN, date of birth, ID band Patient awake    Reviewed: Allergy & Precautions, H&P , NPO status , Patient's Chart, lab work & pertinent test results  History of Anesthesia Complications Negative for: history of anesthetic complications  Airway Mallampati: II TM Distance: >3 FB Neck ROM: Full    Dental  (+) Teeth Intact, Dental Advisory Given,    Pulmonary former smoker,    Pulmonary exam normal       Cardiovascular negative cardio ROS      Neuro/Psych negative neurological ROS  negative psych ROS   GI/Hepatic negative GI ROS, Neg liver ROS,   Endo/Other  negative endocrine ROS  Renal/GU negative Renal ROS  negative genitourinary   Musculoskeletal negative musculoskeletal ROS (+)   Abdominal   Peds negative pediatric ROS (+)  Hematology negative hematology ROS (+)   Anesthesia Other Findings   Reproductive/Obstetrics negative OB ROS                       Anesthesia Physical Anesthesia Plan  ASA: II  Anesthesia Plan: General   Post-op Pain Management:    Induction: Intravenous  Airway Management Planned: Oral ETT  Additional Equipment:   Intra-op Plan:   Post-operative Plan: Extubation in OR  Informed Consent: I have reviewed the patients History and Physical, chart, labs and discussed the procedure including the risks, benefits and alternatives for the proposed anesthesia with the patient or authorized representative who has indicated his/her understanding and acceptance.   Dental advisory given  Plan Discussed with: CRNA, Anesthesiologist and Surgeon  Anesthesia Plan Comments:        Anesthesia Quick Evaluation

## 2014-06-25 NOTE — Progress Notes (Signed)
Pt doing well. He is OOB ambulating, voiding, and tolerating food. Pt given D/C instructions with Rx, verbal understanding was provided. Pt's incision was clean and dry with no signs of infection. Pt's IV was removed prior to D/C. Pt D/C'd home via walking per MD order. Pt is stable @ D/C and has no other needs at this time. Holli Humbles, RN

## 2014-06-25 NOTE — Anesthesia Postprocedure Evaluation (Signed)
Anesthesia Post Note  Patient: Frank Cruz  Procedure(s) Performed: Procedure(s) (LRB): LUMBAR FIVE TO SACRAL ONE LUMBAR LAMINECTOMY/DECOMPRESSION MICRODISCECTOMY 1 LEVEL (Left)  Anesthesia type: general  Patient location: PACU  Post pain: Pain level controlled  Post assessment: Patient's Cardiovascular Status Stable  Last Vitals:  Filed Vitals:   06/25/14 1023  BP: 122/77  Pulse: 59  Temp: 36.4 C  Resp: 16    Post vital signs: Reviewed and stable  Level of consciousness: alert  Complications: No apparent anesthesia complications

## 2014-06-25 NOTE — Transfer of Care (Signed)
Immediate Anesthesia Transfer of Care Note  Patient: Frank Cruz  Procedure(s) Performed: Procedure(s): LUMBAR FIVE TO SACRAL ONE LUMBAR LAMINECTOMY/DECOMPRESSION MICRODISCECTOMY 1 LEVEL (Left)  Patient Location: PACU  Anesthesia Type:General  Level of Consciousness: awake, alert , oriented and patient cooperative  Airway & Oxygen Therapy: Patient Spontanous Breathing and Patient connected to nasal cannula oxygen  Post-op Assessment: Report given to PACU RN, Post -op Vital signs reviewed and stable and Patient moving all extremities X 4  Post vital signs: Reviewed and stable  Complications: No apparent anesthesia complications

## 2014-06-25 NOTE — Brief Op Note (Signed)
06/25/2014  9:18 AM  PATIENT:  Frank Cruz  53 y.o. male  PRE-OPERATIVE DIAGNOSIS:  Herniated Nucleus Pulposus  POST-OPERATIVE DIAGNOSIS:  Herniated Nucleus Pulposus  PROCEDURE:  Procedure(s): LUMBAR FIVE TO SACRAL ONE LUMBAR LAMINECTOMY/DECOMPRESSION MICRODISCECTOMY 1 LEVEL (Left)  SURGEON:  Surgeon(s) and Role:    * Charlie Pitter, MD - Primary    * Hosie Spangle, MD - Assisting  PHYSICIAN ASSISTANT:   ASSISTANTS:    ANESTHESIA:   general  EBL:  Total I/O In: 1100 [I.V.:1100] Out: 40 [Blood:40]  BLOOD ADMINISTERED:none  DRAINS: none   LOCAL MEDICATIONS USED:  MARCAINE     SPECIMEN:  No Specimen  DISPOSITION OF SPECIMEN:  N/A  COUNTS:  YES  TOURNIQUET:  * No tourniquets in log *  DICTATION: .Dragon Dictation  PLAN OF CARE: Admit for overnight observation  PATIENT DISPOSITION:  PACU - hemodynamically stable.   Delay start of Pharmacological VTE agent (>24hrs) due to surgical blood loss or risk of bleeding: yes

## 2014-06-25 NOTE — Op Note (Signed)
Date of procedure: 06/25/2014  Date of dictation: Same  Service: Neurosurgery  Preoperative diagnosis: Left L5-S1 herniated nucleus pulposus with radiculopathy  Postoperative diagnosis: Same  Procedure Name: Left L5-S1 laminotomy and microdiscectomy.  Surgeon:Scarleth Brame A.Wilson Dusenbery, M.D.  Asst. Surgeon: Sherwood Gambler  Anesthesia: General  Indication: 53 year old male with severe left lower extremity pain consistent with a left-sided S1 radiculopathy failing conservative management. Workup demonstrates evidence of a large left-sided L5-S1 disc herniation. Patient presents now for microdiscectomy.  Operative note: After induction anesthesia, patient positioned prone onto Wilson frame and appropriately padded. Lumbar region prepped and draped. Incision made overlying L5-S1. Dissection performed the left side. Retractor placed. X-ray taken. Level confirmed. Laminotomy performed using high-speed drill anchors rongeurs. Ligamentum flavum elevated and resected. Underlying thecal sac and S1 nerve root identified. Microscope brought field these were microdissection spinal canal. Epidural venous plexus quite related and cut. Thecal sac and S1 nerve root gently mobilized and retracted towards midline. Large disc herniation was encountered at and just superior to the disc space. This is dissected free and removed in piecemeal fashion using pituitary rongeurs. The disc space itself was quite collapsed and flattened. All elements the disc herniation were completely resected. The disc space itself was not entered. This point a very thorough decompression had been achieved. There is no evidence of injury to the thecal sac or nerve roots. Wound is then irrigated and closed with Vicryl sutures in layers. Steri-Strips and sterile dressing were applied. No apparent complications. Patient tolerated the procedure well and he returns to the recovery room postop.

## 2014-06-25 NOTE — Discharge Summary (Signed)
Physician Discharge Summary  Patient ID: Frank Cruz MRN: [id number] DOB/AGE: June 09, 1961 53 y.o.  Admit date: 06/25/2014 Discharge date: 06/25/2014  Admission Diagnoses:  Discharge Diagnoses:  Principal Problem:   HNP (herniated nucleus pulposus), lumbar   Discharged Condition: good  Hospital Course: The patient was admitted to the hospital where he underwent an uncomplicated left-sided J5-T0 laminotomy and microdiscectomy. Postoperatively he is done well. He is up ambulating. He is ready for discharge home.  Consults:   Significant Diagnostic Studies:   Treatments:   Discharge Exam: Blood pressure 97/63, pulse 57, temperature 97.9 F (36.6 C), temperature source Oral, resp. rate 20, height 5' 11.5" (1.816 m), weight 79.379 kg (175 lb), SpO2 99.00%. Awake and alert. Oriented and appropriate. Motor and sensory function intact. Wound clean and dry. Chest and abdomen benign. Disposition: Final discharge disposition not confirmed     Medication List         oxyCODONE-acetaminophen 5-325 MG per tablet  Commonly known as:  PERCOCET/ROXICET  Take 1-2 tablets by mouth every 4 (four) hours as needed for severe pain.         Signed: Mayley Lish A 06/25/2014, 9:27 AM

## 2014-06-25 NOTE — Discharge Instructions (Signed)

## 2014-06-26 ENCOUNTER — Encounter (HOSPITAL_COMMUNITY): Payer: Self-pay | Admitting: Neurosurgery

## 2014-08-29 ENCOUNTER — Ambulatory Visit (INDEPENDENT_AMBULATORY_CARE_PROVIDER_SITE_OTHER): Payer: BC Managed Care – PPO | Admitting: Internal Medicine

## 2014-08-29 ENCOUNTER — Encounter: Payer: Self-pay | Admitting: Internal Medicine

## 2014-08-29 VITALS — BP 90/56 | HR 60 | Temp 97.9°F | Resp 16 | Ht 71.25 in | Wt 180.4 lb

## 2014-08-29 DIAGNOSIS — J0410 Acute tracheitis without obstruction: Secondary | ICD-10-CM

## 2014-08-29 DIAGNOSIS — J320 Chronic maxillary sinusitis: Secondary | ICD-10-CM

## 2014-08-29 MED ORDER — AZITHROMYCIN 250 MG PO TABS: ORAL_TABLET | ORAL | Status: AC

## 2014-08-29 MED ORDER — HYDROCODONE-ACETAMINOPHEN 5-325 MG PO TABS: ORAL_TABLET | ORAL | Status: AC

## 2014-08-29 MED ORDER — PREDNISONE 20 MG PO TABS
ORAL_TABLET | ORAL | Status: AC
Start: 2014-08-29 — End: 2014-08-29

## 2014-08-29 NOTE — Patient Instructions (Signed)

## 2014-08-31 NOTE — Progress Notes (Signed)
   Subjective:    Patient ID: Frank Cruz, male    DOB: [birth year], 53 y.o.   MRN: [id number]  HPI  Patient presents with a 1 week prodrome of sinus pressure / congestion and drainage and productive cough. Medication Sig  .  PERCOCET 5-325  Take 1-2 tablets  every 4 hours as needed for pain (recent back surg)   No Known Allergies  Past Medical History  Diagnosis Date  . Allergy   . Hyperlipidemia   . Lumbago   . Vitamin D deficiency   . DVT (deep venous thrombosis) 2013ish    left    Review of Systems     Negative or non-contributory except as above    Objective:   Physical Exam      BP 90/56   Pulse 60  Temp 97.9 F   Resp 16  Ht 5' 11.25"  Wt 180 lb 6.4 oz   BMI 24.98  HEENT - Eac's patent. TM's Nl. EOM's full. PERRLA. (+) tender frontal & maxillary areas. NasoOroPharynx clear. Neck - supple. Nl Thyroid. Carotids 2+ & No bruits, nodes, JVD Chest - Clear equal BS w/few scattered dry rales - no rhonchi, wheezes. Cor - Nl HS. RRR w/o sig MGR. PP 1(+). No edema. MS- FROM w/o deformities. Muscle power, tone and bulk Nl. Gait Nl. Neuro - No obvious Cr N abnormalities. Sensory, motor and Cerebellar functions appear Nl w/o focal abnormalities. Psyche - Mental status normal & appropriate.  No delusions, ideations or obvious mood abnormalities.    Assessment & Plan:   1. Maxillary sinusitis, unspecified chronicity  2. Tracheitis   - Rx Z pak & 1 rf;  Prednisone taper; Norco 5 x 1/2 prn cough  - discussed med effects/SEs - ROV - prn

## 2015-05-07 ENCOUNTER — Other Ambulatory Visit: Payer: Self-pay | Admitting: Internal Medicine

## 2015-05-07 DIAGNOSIS — Z1211 Encounter for screening for malignant neoplasm of colon: Secondary | ICD-10-CM

## 2015-05-11 ENCOUNTER — Ambulatory Visit (AMBULATORY_SURGERY_CENTER): Payer: Self-pay | Admitting: *Deleted

## 2015-05-11 VITALS — Ht 72.0 in | Wt 171.0 lb

## 2015-05-11 DIAGNOSIS — Z1211 Encounter for screening for malignant neoplasm of colon: Secondary | ICD-10-CM

## 2015-05-11 MED ORDER — NA SULFATE-K SULFATE-MG SULF 17.5-3.13-1.6 GM/177ML PO SOLN: ORAL | Status: DC

## 2015-05-11 NOTE — Progress Notes (Signed)
Patient denies any allergies to eggs or soy. Patient denies any problems with anesthesia/sedation. Patient denies any oxygen use at home and does not take any diet/weight loss medications. Patient declined EMMI information at this time.

## 2015-05-13 ENCOUNTER — Encounter: Payer: Self-pay | Admitting: Internal Medicine

## 2015-05-13 ENCOUNTER — Ambulatory Visit: Payer: Self-pay | Admitting: Internal Medicine

## 2015-05-28 ENCOUNTER — Ambulatory Visit (AMBULATORY_SURGERY_CENTER): Payer: 59 | Admitting: Gastroenterology

## 2015-05-28 ENCOUNTER — Encounter: Payer: Self-pay | Admitting: Gastroenterology

## 2015-05-28 VITALS — BP 124/58 | HR 46 | Temp 97.0°F | Resp 15 | Ht 72.0 in | Wt 171.0 lb

## 2015-05-28 DIAGNOSIS — D120 Benign neoplasm of cecum: Secondary | ICD-10-CM

## 2015-05-28 DIAGNOSIS — D125 Benign neoplasm of sigmoid colon: Secondary | ICD-10-CM

## 2015-05-28 DIAGNOSIS — Z1211 Encounter for screening for malignant neoplasm of colon: Secondary | ICD-10-CM

## 2015-05-28 MED ORDER — SODIUM CHLORIDE 0.9 % IV SOLN: 500.0000 mL | INTRAVENOUS | Status: DC

## 2015-05-28 NOTE — Patient Instructions (Signed)
YOU HAD AN ENDOSCOPIC PROCEDURE TODAY AT THE [facility]:   Refer to the procedure report that was given to you for any specific questions about what was found during the examination.  If the procedure report does not answer your questions, please call your gastroenterologist to clarify.  If you requested that your care partner not be given the details of your procedure findings, then the procedure report has been included in a sealed envelope for you to review at your convenience later.  YOU SHOULD EXPECT: Some feelings of bloating in the abdomen. Passage of more gas than usual.  Walking can help get rid of the air that was put into your GI tract during the procedure and reduce the bloating. If you had a lower endoscopy (such as a colonoscopy or flexible sigmoidoscopy) you may notice spotting of blood in your stool or on the toilet paper. If you underwent a bowel prep for your procedure, you may not have a normal bowel movement for a few days.  Please Note:  You might notice some irritation and congestion in your nose or some drainage.  This is from the oxygen used during your procedure.  There is no need for concern and it should clear up in a day or so.  SYMPTOMS TO REPORT IMMEDIATELY:   Following lower endoscopy (colonoscopy or flexible sigmoidoscopy):  Excessive amounts of blood in the stool  Significant tenderness or worsening of abdominal pains  Swelling of the abdomen that is new, acute  Fever of 100F or higher   For urgent or emergent issues, a gastroenterologist can be reached at any hour by calling [phone].   DIET: Your first meal following the procedure should be a small meal and then it is ok to progress to your normal diet. Heavy or fried foods are harder to digest and may make you feel nauseous or bloated.  Likewise, meals heavy in dairy and vegetables can increase bloating.  Drink plenty of fluids but you should avoid alcoholic beverages for 24  hours.  ACTIVITY:  You should plan to take it easy for the rest of today and you should NOT DRIVE or use heavy machinery until tomorrow (because of the sedation medicines used during the test).    FOLLOW UP: Our staff will call the number listed on your records the next business day following your procedure to check on you and address any questions or concerns that you may have regarding the information given to you following your procedure. If we do not reach you, we will leave a message.  However, if you are feeling well and you are not experiencing any problems, there is no need to return our call.  We will assume that you have returned to your regular daily activities without incident.  If any biopsies were taken you will be contacted by phone or by letter within the next 1-3 weeks.  Please call us at [phone] if you have not heard about the biopsies in 3 weeks.    SIGNATURES/CONFIDENTIALITY: You and/or your care partner have signed paperwork which will be entered into your electronic medical record.  These signatures attest to the fact that that the information above on your After Visit Summary has been reviewed and is understood.  Full responsibility of the confidentiality of this discharge information lies with you and/or your care-partner.     Handout was given to your care partner on polyps. You may resume your current medications today. Await biopsy results. Please call   if any questions or concerns.   

## 2015-05-28 NOTE — Op Note (Signed)
[place]  [address] [employer] [address] [place], [address]   COLONOSCOPY PROCEDURE REPORT  PATIENT: Frank Cruz, Frank Cruz  MR#: [id number] BIRTHDATE: 04/02/1961 , 49  yrs. old GENDER: male ENDOSCOPIST: Ladene Artist, MD, [facility] REFERRED YI:RSWNIOE Melford Aase, M.D. PROCEDURE DATE:  05/28/2015 PROCEDURE:   Colonoscopy, screening and Colonoscopy with snare polypectomy First Screening Colonoscopy - Avg.  risk and is 50 yrs.  old or older Yes.  Prior Negative Screening - Now for repeat screening. N/A  History of Adenoma - Now for follow-up colonoscopy & has been > or = to 3 yrs.  N/A  Polyps removed today? Yes ASA CLASS:   Class II INDICATIONS:Screening for colonic neoplasia and Colorectal Neoplasm Risk Assessment for this procedure is average risk. MEDICATIONS: Monitored anesthesia care and Propofol 250 mg IV DESCRIPTION OF PROCEDURE:   After the risks benefits and alternatives of the procedure were thoroughly explained, informed consent was obtained.  The digital rectal exam revealed no abnormalities of the rectum.   The LB [id number]  endoscope was introduced through the anus and advanced to the cecum, which was identified by both the appendix and ileocecal valve. No adverse events experienced.   The quality of the prep was good.  (Suprep was used)  The instrument was then slowly withdrawn as the colon was fully examined. Estimated blood loss is zero unless otherwise noted in this procedure report.    COLON FINDINGS: A sessile polyp measuring 6 mm in size was found at the ileocecal valve.  A polypectomy was performed with a cold snare.  The resection was complete, the polyp tissue was completely retrieved and sent to histology.   Four sessile polyps measuring 5-6 mm in size were found in the sigmoid colon.  Polypectomies were performed with a cold snare.  The resection was complete, the polyp tissue was completely retrieved and sent to histology.   The examination was  otherwise normal.  Retroflexed views revealed no abnormalities. The time to cecum = 2.6 Withdrawal time = 13.0   The scope was withdrawn and the procedure completed. COMPLICATIONS: There were no immediate complications.  ENDOSCOPIC IMPRESSION: 1.   Sessile polyp at the ileocecal valve; polypectomy performed with a cold snare 2.   Four sessile polyps in the sigmoid colon; polypectomies performed with a cold snare 3.   The examination was otherwise normal  RECOMMENDATIONS: 1.  Await pathology results 2.  Repeat colonoscopy in 3 years if 3-5 polyps precancerous; 5 years if 1-2 percancerous; otherwise 10 years  eSigned:  Ladene Artist, MD, [facility] 05/28/2015 9:02 AM

## 2015-05-28 NOTE — Progress Notes (Signed)
No problems noted in the recovery room. maw 

## 2015-05-28 NOTE — Progress Notes (Signed)
Transferred to recovery room. A/O x3, pleased with MAC.  VSS.  Report to Annette, RN. 

## 2015-05-28 NOTE — Progress Notes (Signed)
Called to room to assist during endoscopic procedure.  Patient ID and intended procedure confirmed with present staff. Received instructions for my participation in the procedure from the performing physician.  

## 2015-06-01 ENCOUNTER — Telehealth: Payer: Self-pay

## 2015-06-01 NOTE — Telephone Encounter (Signed)
Left a message at [phone] for the pt to call if any questions or concerns. maw

## 2015-06-02 ENCOUNTER — Encounter: Payer: Self-pay | Admitting: Gastroenterology

## 2015-10-21 ENCOUNTER — Ambulatory Visit (INDEPENDENT_AMBULATORY_CARE_PROVIDER_SITE_OTHER): Payer: BLUE CROSS/BLUE SHIELD | Admitting: Internal Medicine

## 2015-10-21 ENCOUNTER — Encounter: Payer: Self-pay | Admitting: Internal Medicine

## 2015-10-21 VITALS — BP 110/72 | HR 78 | Temp 98.2°F | Resp 18 | Ht 71.25 in | Wt 183.0 lb

## 2015-10-21 DIAGNOSIS — J069 Acute upper respiratory infection, unspecified: Secondary | ICD-10-CM | POA: Diagnosis not present

## 2015-10-21 MED ORDER — PREDNISONE 20 MG PO TABS: ORAL_TABLET | ORAL | Status: DC

## 2015-10-21 MED ORDER — MOMETASONE FUROATE 50 MCG/ACT NA SUSP
2.0000 | Freq: Every day | NASAL | Status: DC
Start: 2015-10-21 — End: 2016-02-23

## 2015-10-21 NOTE — Progress Notes (Signed)
Patient ID: Frank Cruz, male   DOB: 10-14-1960, 55 y.o.   MRN: [id number]  HPI  Patient presents to the office for evaluation of cough and sinus issues.  It has been going on for 3 weeks.  Patient reports minimal throat clearing.  They also endorse change in voice, postnasal drip and nasal congestion, headache, clear nasal sputum.  .  They have tried nasal saline with minimal relief.  He has not been taking allegra any more.  They report that nothing has worked.  They denies other sick contacts.  Review of Systems  Constitutional: Positive for malaise/fatigue. Negative for fever and chills.  HENT: Positive for congestion and ear pain. Negative for sore throat.   Respiratory: Negative for cough, shortness of breath and wheezing.   Cardiovascular: Negative for chest pain, palpitations and leg swelling.  Neurological: Positive for headaches.    PE:  Filed Vitals:   10/21/15 1353  BP: 110/72  Pulse: 78  Temp: 98.2 F (36.8 C)  Resp: 18   General:  Alert and non-toxic, WDWN, NAD HEENT: NCAT, PERLA, EOM normal, no occular discharge or erythema.  Nasal mucosal edema with sinus tenderness to palpation.  Oropharynx clear with minimal oropharyngeal edema and erythema.  Mucous membranes moist and pink. Neck:  Cervical adenopathy Chest:  RRR no MRGs.  Lungs clear to auscultation A&P with no wheezes rhonchi or rales.   Abdomen: +BS x 4 quadrants, soft, non-tender, no guarding, rigidity, or rebound. Skin: warm and dry no rash Neuro: A&Ox4, CN II-XII grossly intact  Assessment and Plan:   1. Acute URI -nasal saline -allegra - mometasone (NASONEX) 50 MCG/ACT nasal spray; Place 2 sprays into the nose daily.  Dispense: 17 g; Refill: 2 - predniSONE (DELTASONE) 20 MG tablet; 3 tabs po daily x 3 days, then 2 tabs x 3 days, then 1.5 tabs x 3 days, then 1 tab x 3 days, then 0.5 tabs x 3 days  Dispense: 27 tablet; Refill: 0

## 2016-02-23 ENCOUNTER — Encounter: Payer: Self-pay | Admitting: Internal Medicine

## 2016-02-23 ENCOUNTER — Ambulatory Visit (INDEPENDENT_AMBULATORY_CARE_PROVIDER_SITE_OTHER): Payer: BLUE CROSS/BLUE SHIELD | Admitting: Internal Medicine

## 2016-02-23 VITALS — BP 96/62 | HR 72 | Temp 97.6°F | Resp 16 | Ht 71.25 in | Wt 181.4 lb

## 2016-02-23 DIAGNOSIS — W57XXXA Bitten or stung by nonvenomous insect and other nonvenomous arthropods, initial encounter: Secondary | ICD-10-CM

## 2016-02-23 DIAGNOSIS — Z79899 Other long term (current) drug therapy: Secondary | ICD-10-CM

## 2016-02-23 DIAGNOSIS — T148 Other injury of unspecified body region: Secondary | ICD-10-CM

## 2016-02-23 LAB — CBC WITH DIFFERENTIAL/PLATELET
BASOS ABS: 68 {cells}/uL (ref 0–200)
BASOS PCT: 1 %
EOS PCT: 2 %
Eosinophils Absolute: 136 cells/uL (ref 15–500)
HCT: 48.9 % (ref 38.5–50.0)
HEMOGLOBIN: 16.5 g/dL (ref 13.2–17.1)
LYMPHS ABS: 1836 {cells}/uL (ref 850–3900)
Lymphocytes Relative: 27 %
MCH: 31.8 pg (ref 27.0–33.0)
MCHC: 33.7 g/dL (ref 32.0–36.0)
MCV: 94.2 fL (ref 80.0–100.0)
MPV: 12.2 fL (ref 7.5–12.5)
Monocytes Absolute: 612 cells/uL (ref 200–950)
Monocytes Relative: 9 %
NEUTROS ABS: 4148 {cells}/uL (ref 1500–7800)
Neutrophils Relative %: 61 %
Platelets: 145 10*3/uL (ref 140–400)
RBC: 5.19 MIL/uL (ref 4.20–5.80)
RDW: 13.5 % (ref 11.0–15.0)
WBC: 6.8 10*3/uL (ref 3.8–10.8)

## 2016-02-23 LAB — BASIC METABOLIC PANEL WITH GFR
BUN: 19 mg/dL (ref 7–25)
CHLORIDE: 101 mmol/L (ref 98–110)
CO2: 26 mmol/L (ref 20–31)
Calcium: 9.8 mg/dL (ref 8.6–10.3)
Creat: 1.19 mg/dL (ref 0.70–1.33)
GFR, EST NON AFRICAN AMERICAN: 69 mL/min (ref >=60)
GFR, Est African American: 80 mL/min (ref >=60)
Glucose, Bld: 76 mg/dL (ref 65–99)
POTASSIUM: 4.3 mmol/L (ref 3.5–5.3)
SODIUM: 139 mmol/L (ref 135–146)

## 2016-02-23 LAB — HEPATIC FUNCTION PANEL
ALT: 19 U/L (ref 9–46)
AST: 18 U/L (ref 10–35)
Albumin: 4.7 g/dL (ref 3.6–5.1)
Alkaline Phosphatase: 41 U/L (ref 40–115)
BILIRUBIN DIRECT: 0.1 mg/dL (ref <=0.2)
BILIRUBIN INDIRECT: 0.3 mg/dL (ref 0.2–1.2)
Total Bilirubin: 0.4 mg/dL (ref 0.2–1.2)
Total Protein: 6.7 g/dL (ref 6.1–8.1)

## 2016-02-23 NOTE — Patient Instructions (Addendum)
Ehrlichiosis and Anaplasmosis Ehrlichiosis and anaplasmosis are diseases caused by bacteria and carried by ticks. Other names for these infections are:  Human monocytic ehrlichiosis (HME).  Human granulocytotropic anaplasmosis (HGA). HME mostly occurs in the [place] and [place], where the lone star tick lives. However, infections have occurred in 72 states. HGA infections are limited to fewer geographic locations. Most cases are reported from [place], [place], [place], [place], and [place]. This distribution is almost identical to that of Lyme disease because of the shared species of ixodid ticks (wood ticks, deer ticks). CAUSES   HME is caused by Ehrlichia chaffeensis and other closely related ehrlichia bacteria.  HGA is caused by the bacteria Anaplasma phagocytophilum. An infected adult tick transmits the infection by biting a human. Once a tick gains access to human skin, it generally climbs upward until it reaches a more protected area. This is often the back of the knee, groin, navel, armpit, ears, or nape of the neck. It then begins the slow process of embedding itself in the skin. Adult ticks are active during warmer times of the year. For this reason, most infections occur between late spring and early fall. SYMPTOMS  Many infected people have no symptoms. For those with symptoms, HME and HGA cause similar illnesses. Symptoms typically begin 1 week or more after a tick bite and may include:  Fever.  Headache.  Chills or shaking.  Fatigue.  Muscle pain.  Nausea.  Loss of appetite.  Vomiting.  Diarrhea. Symptoms commonly last for 1 to 3 weeks if a patient is not diagnosed or not treated with an antibiotic. Extremely severe disease is rare, but occasional deaths from infection have been reported. DIAGNOSIS  Diagnosis is suggested by a history of tick bites or potential exposure to ticks. Blood tests may show abnormalities of liver  function and low counts of white blood cells and platelets. To confirm the diagnosis, the bacteria must be found in a smear of blood on a microscope slide or during testing of the liquid part of your blood (serum). TREATMENT  Treatment with an antibiotic is almost always effective in eliminating symptoms within a couple days and curing the infection.  PREVENTION Ticks prefer to hide in shady, moist ground. However, they can often be found above the ground clinging to tall grass, brush, shrubs, and low tree branches. They also inhabit lawns and gardens, especially at the edges of woodlands and around old stone walls. Within the normal geographic areas where HME and HGA occur, no vegetated area can be considered completely free of infected ticks. In tick-infested areas, the best precaution against infection is to avoid contact with soil, leaf litter, and vegetation as much as possible. Campers, hikers, field workers, and others who spend time in wooded, brushy, or tall grassy areas can avoid exposure to ticks by using the following precautions:  Wear light-colored clothing with a tight weave to spot ticks more easily and prevent contact with the skin.  Wear long pants tucked into socks, long-sleeve shirts tucked into pants, and enclosed shoes or boots.  Use insect repellent. Spray clothes with insect repellent containing either DEET or permethrin. Only DEET can be used on exposed skin. Make sure to follow the manufacturer's directions carefully.  Wear a hat and keep long hair pulled back.  Stay on cleared, well-worn trails whenever possible.  Check yourself and others frequently for the presence of ticks on clothes. If you find one tick, there may be more. Check thoroughly.  Remove clothes after leaving tick-infested areas and, if possible, wash them to eliminate any unseen ticks. Check yourself, your children, and any pets from head to toe for the presence of ticks.  When attached ticks are found,  you can greatly reduce your chances of getting HME and HGA if you remove them as soon as possible. Use a tweezers to grab hold of the tick by its mouth parts and pull it off.  Shower and shampoo after possible exposure to ticks. HOME CARE INSTRUCTIONS Take your antibiotics as directed. Finish them even if you start to feel better. SEEK MEDICAL CARE IF:   You have a fever.  You develop a headache.  You develop fatigue.  You develop muscle pain.  You develop nausea, vomiting, or diarrhea. MAKE SURE YOU:  Understand these instructions.  Will watch your condition.  Will get help right away if you are not doing well or get worse.  .  +++++++++++++++++++++++++++++++++++++++++++ Lyme Disease Lyme disease is an infection that affects many parts of the body, including the skin, joints, and nervous system. CAUSES Lyme disease is caused by bacteria called Borrelia burgdorferi. You can get Lyme disease by being bitten by an infected tick. The tick must be attached to your skin for at least 36 hours to transmit the infection. Deer often carry infected ticks. RISK FACTORS  Living in or visiting [place] states, or the [place].  Spending time in wooded or grassy areas.  Being outdoors with exposed skin.  Failing to remove a tick from your skin within 3-4 days. SIGNS AND SYMPTOMS  A round, red rash that comes out from the center of the tick bite. This is the first sign of infection. The center of the rash may be blood colored or have tiny blisters.  Fatigue.  Headache.  Chills and fever.  General achiness.  Joint pain, often in the knee.  Swollen lymph glands. DIAGNOSIS Lyme disease is diagnosed with a medical history, physical exam, and blood test. TREATMENT The main treatment is antibiotic medicine, usually taken by mouth. The length of treatment depends on how soon after a tick bite you begin taking the medicine. In some cases, treatment is  necessary for several weeks. If the infection is severe, IV antibiotics may be necessary. HOME CARE INSTRUCTIONS  Take your antibiotic medicine as directed by your health care provider. Finish the antibiotic even if you start to feel better.  You may take a probiotic in between doses of your antibiotic to help avoid stomach upset or diarrhea.  Check with your health care provider before supplementing your treatment. Many alternative therapies have not been proven and may be harmful to you.  Keep all follow-up visits as directed by your health care provider. This is important. PREVENTION Reinfection is possible with another tick bite by an infected tick. Take these precautions to prevent an infection:  Cover your skin with light-colored clothing when outdoors in the spring and summer months.  Spray clothing and skin with bug spray. The spray should be 20-30% DEET.  Avoided wooded, grassy, and shaded areas.  Remove yard litter, brush, trash, and plants that attract deer and rodents.  Check yourself for ticks when you come indoors.  Wash clothing worn each day.  Check your pets for ticks before they come inside.  If you find a tick:  Remove it with tweezers.  Clean your hands and the bite area with rubbing alcohol or soap and water. Pregnant women should take special care  to avoid tick bites because the infection can be passed along to the fetus. SEEK MEDICAL CARE IF:  You have symptoms after treatment.  You have removed a tick and want to bring it to your health care provider for testing. SEEK IMMEDIATE MEDICAL CARE IF:  You have an irregular heartbeat.  You have nerve pain.  Your face feels numb. MAKE SURE YOU:  Understand these instructions.  Will watch your condition.  Will get help right away if you are not doing well or get worse.    +++++++++++++++++++++++++++++++++++++++++ [facility] Spotted Fever [facility] spotted fever is an illness that is  spread to people by infected ticks. The illness causes flulike symptoms and a reddish-purple rash. This illness can quickly become very serious. Treatment must be started right away. When the illness is not treated right away, it can sometimes lead to long-term health problems or even death. This illness is most common during warm weather when ticks are most active. CAUSES [facility] spotted fever is caused by a type of bacteria that is called Rickettsia rickettsii. This type of bacteria is carried by [place] dog ticks and [employer]. People get infected through a bite from a tick that is infected with the bacteria. The bite is painless, and it frequently goes unnoticed. The bacteria can also infect a person when tick blood or tick feces get into a person's body through damaged skin. A tick bite is not necessary for an infection to occur. People can get [facility] spotted fever if they get a tick's blood or body fluids on their skin in the area of a small cut or sore. This could happen while removing a tick from another person or a dog. The infection is not contagious, and it cannot be spread (transmitted) from person to person. SIGNS AND SYMPTOMS Symptoms may begin 2-14 days after a tick bite. The most common early symptoms are:  Fever.  Muscle aches.  Headache.  Nausea.  Vomiting.  Poor appetite.  Abdominal pain. The reddish-purple rash usually appears 3-5 days after the first symptoms begin. The rash often starts on the wrists and ankles. It may then spread to the palms, the soles of the feet, the legs, and the trunk. DIAGNOSIS Diagnosis is based on a physical exam, medical history, and blood tests. Your health care provider may suspect [facility] spotted fever in one of these cases:   If you have recently been bitten by a tick.  If you have been in areas that have a lot of ticks or in areas where the disease is common. TREATMENT It is important to begin  treatment right away. Treatment will usually involve the use of antibiotic medicines. In some cases, your health care provider may begin treatment before the diagnosis is confirmed. If your symptoms are severe, a hospital stay may be needed. HOME CARE INSTRUCTIONS  Rest as much as possible until you feel better.  Take medicines only as directed by your health care provider.  Take your antibiotic medicine as directed by your health care provider. Finish the antibiotic even if you start to feel better.  Drink enough fluid to keep your urine clear or pale yellow.  Keep all follow-up visits as directed by your health care provider. This is important. PREVENTION Avoiding tick bites can help to prevent this illness. Take these steps to avoid tick bites when you are outdoors:  Be aware that most ticks live in shrubs, low tree branches, and grassy areas. A  tick can climb onto your body when you make contact with leaves or grass where the tick is waiting.  Wear protective clothing. Long sleeves and long pants are best.  Wear white clothes so you can see ticks more easily.  Tuck your pant legs into your socks.  If you go walking on a trail, stay in the middle of the trail to avoid brushing against bushes.  Avoid walking through areas that have long grass.  Put insect repellent on all exposed skin and along boot tops, pant legs, and sleeve cuffs.  Check clothing, hair, and skin repeatedly and before going inside.  Check family members and pets for ticks.  Brush off any ticks that are not attached.  Take a shower or a bath as soon as possible after you have been outdoors. Check your skin for ticks. The most common places on the body where ticks attach themselves are the scalp, neck, armpits, waist, and groin. You can also greatly reduce your chances of getting [facility] spotted fever if you remove attached ticks as soon as possible. To remove an attached tick, use a forceps or fine-point  tweezers to detach the intact tick without leaving its mouth parts in the skin. The wound from the tick bite should be washed after the tick has been removed. SEEK MEDICAL CARE IF:  You have drainage, swelling, or increased redness or pain in the area of the rash. SEEK IMMEDIATE MEDICAL CARE IF:  You have chest pain.  You have shortness of breath.  You have a severe headache.  You have a seizure.  You have severe abdominal pain.  You are feeling confused.  You are bruising easily.  You have bleeding from your gums.  You have blood in your stool.

## 2016-02-23 NOTE — Progress Notes (Signed)
  Subjective:    Patient ID: Sharl Ma, male    DOB: 01/19/1961, 55 y.o.   MRN: [id number]  HPI  This 55 yo single WM presents with c/o "poppy seed" tick bites 1 week ago of his R arm & R abdomen and after researching on the internet he decided to come for an evaluation. He reports fatigue and HA and denies fever, confusion, rashes and systems review is otherwise negative.   Medication Sig  . fexofenadine  180 MG  Take 180 mg by mouth daily.  Marland Kitchen NASONEX nasal spray Place 2 sprays into the nose daily.  . Multiple Vitamin  Take 1 tablet by mouth daily.   No Known Allergies   Past Medical History  Diagnosis Date  . Allergy   . Hyperlipidemia   . Lumbago   . Vitamin D deficiency   . DVT (deep venous thrombosis) ([place]) 2013    left    Past Surgical History  Procedure Date  . Wisdom tooth extraction   . Lumbar laminectomy/decompression microdiscectomy - Charlie Pitter, MD 06/25/2014   Review of Systems  10 point systems review negative except as above.    Objective:   Physical Exam  BP 96/62 mmHg  Pulse 72  Temp(Src) 97.6 F (36.4 C)  Resp 16  Ht 5' 11.25" (1.81 m)  Wt 181 lb 6.4 oz (82.283 kg)  BMI 25.12 kg/m2  HEENT - Eac's patent. TM's Nl. EOM's full. PERRLA. NasoOroPharynx clear. Neck - supple.  Chest - Clear.Cor - Nl HS. RRR w/o sig MGR. PP 1(+). No edema. MS- FROM. Gait Nl. Neuro - No obvious Cr N abnormalities.  Nl w/o focal abnormalities. Skin - tiny areas ~2 mm eschar of the R arm & R abd which appear clean w/o significant inflammation & no rashes noted .    Assessment & Plan:   1. Tick bite  - Lyme Juliette Alcide. Blt. IgG & IgM w/bands - [place] spotted fvr abs pnl(IgG+IgM) - Ehrlichia Antibody Panel  2. Medication management  - CBC with Differential/Platelet - BASIC METABOLIC PANEL WITH GFR - Hepatic function panel  - defer treatment pending labs or development of suspect sx's

## 2016-02-25 LAB — ROCKY MTN SPOTTED FVR ABS PNL(IGG+IGM)
RMSF IGG: NOT DETECTED
RMSF IgM: NOT DETECTED

## 2016-02-28 LAB — EHRLICHIA ANTIBODY PANEL: E chaffeensis (HGE) Ab, IgG: <1:64 {titer}

## 2016-02-29 ENCOUNTER — Telehealth: Payer: Self-pay | Admitting: *Deleted

## 2016-02-29 LAB — LYME ABY, WSTRN BLT IGG & IGM W/BANDS
B BURGDORFERI IGG ABS (IB): NEGATIVE
B BURGDORFERI IGM ABS (IB): NEGATIVE
LYME DISEASE 23 KD IGG: NONREACTIVE
LYME DISEASE 23 KD IGM: NONREACTIVE
LYME DISEASE 39 KD IGG: NONREACTIVE
LYME DISEASE 41 KD IGG: NONREACTIVE
LYME DISEASE 58 KD IGG: NONREACTIVE
LYME DISEASE 66 KD IGG: NONREACTIVE
LYME DISEASE 93 KD IGG: NONREACTIVE
Lyme Disease 18 kD IgG: NONREACTIVE
Lyme Disease 28 kD IgG: NONREACTIVE
Lyme Disease 30 kD IgG: NONREACTIVE
Lyme Disease 39 kD IgM: NONREACTIVE
Lyme Disease 41 kD IgM: NONREACTIVE
Lyme Disease 45 kD IgG: NONREACTIVE

## 2016-02-29 NOTE — Telephone Encounter (Signed)
Patient called to check on his Lyme disease lab results but those final results can take 13 days, per [facility].  Patient also requested an antibiotic due to having a headache and fatigue.  Per Dr Melford Aase, since he does not have a rash, he can not RX an antibiotic at this time.  The patient became upset and states he will call back to get his medical records.  Dr Melford Aase aware of the situation.

## 2016-03-02 ENCOUNTER — Other Ambulatory Visit: Payer: Self-pay | Admitting: Internal Medicine

## 2016-03-03 ENCOUNTER — Encounter: Payer: Self-pay | Admitting: Primary Care

## 2016-03-03 ENCOUNTER — Ambulatory Visit (INDEPENDENT_AMBULATORY_CARE_PROVIDER_SITE_OTHER): Payer: BLUE CROSS/BLUE SHIELD | Admitting: Primary Care

## 2016-03-03 VITALS — BP 96/62 | HR 87 | Temp 97.7°F | Ht 71.0 in | Wt 184.0 lb

## 2016-03-03 DIAGNOSIS — J3489 Other specified disorders of nose and nasal sinuses: Secondary | ICD-10-CM

## 2016-03-03 DIAGNOSIS — J302 Other seasonal allergic rhinitis: Secondary | ICD-10-CM | POA: Diagnosis not present

## 2016-03-03 DIAGNOSIS — E785 Hyperlipidemia, unspecified: Secondary | ICD-10-CM

## 2016-03-03 MED ORDER — PREDNISONE 10 MG PO TABS: ORAL_TABLET | ORAL | Status: DC

## 2016-03-03 NOTE — Patient Instructions (Signed)
Start Prednisone tablets for sinus pressure. Take 3 tablets for 2 days, then 2 tablets for 2 days, then 1 tablet for 2 days.  Nasal Congestion/Sinus Pressure: Try using Flonase (fluticasone) nasal spray. Instill 1 spray in each nostril twice daily.  Switch from Allegra to Zyrtec for allergy symptoms. Take the Zyrtec at bedtime as it can cause drowsiness.  Please schedule a physical with me in September 2017. You may also schedule a lab only appointment 3-4 days prior. We will discuss your lab results in detail during your physical.  It was a pleasure to meet you today! Please don't hesitate to call me with any questions. Welcome to [facility]!

## 2016-03-03 NOTE — Progress Notes (Signed)
Pre visit review using our clinic review tool, if applicable. No additional management support is needed unless otherwise documented below in the visit note. 

## 2016-03-03 NOTE — Assessment & Plan Note (Signed)
Strong family history. Leads an active and healthy lifestyle. Will repeat lipids this September during annual physical.

## 2016-03-03 NOTE — Progress Notes (Signed)
Subjective:    Patient ID: Frank Cruz, male    DOB: 02/20/1961, 55 y.o.   MRN: [id number]  HPI  Mr. Fairless is a 55 year old male who presents today to establish care and discuss the problems mentioned below. Will obtain old records. Colonoscopy in 2016.   1) Allergic Rhinitis: Seasonal, more so in the Spring months. He is managed on Allegra daily year round which he's taken for 10 years. He experiences sinus pressure, headaches and post nasal drip. Over the past 3 weeks his sinus pressure has become worse which is causing daily, bothersome headaches. He works outdoors often. He typically requires prednisone annually as his allergy symptoms become unbearable.   He found 2 ticks embedded to his skin while he was camping several weeks ago. He felt fatigued, weakness, and developed headaches. He was tested for Lyme's Disease and RMSP which was negative.   2) Hyperlipidemia: Long history of. Leads a healthy lifestyle and runs marathon. He endorses a healthy diet. Strong family history of hyperlipidemia.   Review of Systems  Constitutional: Negative for fever, chills and fatigue.  HENT: Positive for sinus pressure. Negative for congestion and sore throat.   Respiratory: Negative for cough and shortness of breath.   Cardiovascular: Negative for chest pain.  Musculoskeletal: Negative for myalgias and arthralgias.  Allergic/Immunologic: Positive for environmental allergies.  Neurological: Positive for headaches.       Past Medical History  Diagnosis Date  . Allergy   . Hyperlipidemia   . Lumbago   . Vitamin D deficiency   . DVT (deep venous thrombosis) ([place]) 2013ish    left      Social History   Social History  . Marital Status: Divorced    Spouse Name: N/A  . Number of Children: N/A  . Years of Education: N/A   Occupational History  . Not on file.   Social History Main Topics  . Smoking status: Current Some Day Smoker -- 0 years    Types: Cigarettes  . Smokeless  tobacco: Current User    Types: Snuff  . Alcohol Use: 0.0 oz/week    0 Standard drinks or equivalent per week     Comment: soical  . Drug Use: No  . Sexual Activity: Not on file   Other Topics Concern  . Not on file   Social History Narrative   Divorced.   3 children.   Works as an Product manager.   Enjoys building things, hiking, running       Past Surgical History  Procedure Laterality Date  . Wisdom tooth extraction    . Lumbar laminectomy/decompression microdiscectomy Left 06/25/2014    Procedure: LUMBAR FIVE TO SACRAL ONE LUMBAR LAMINECTOMY/DECOMPRESSION MICRODISCECTOMY 1 LEVEL;  Surgeon: Charlie Pitter, MD;  Location: [facility];  Service: Neurosurgery;  Laterality: Left;    Family History  Problem Relation Age of Onset  . Colon cancer Neg Hx   . Hyperlipidemia Father   . Stroke Father   . Dementia Father     No Known Allergies  Current Outpatient Prescriptions on File Prior to Visit  Medication Sig Dispense Refill  . fexofenadine (ALLEGRA) 180 MG tablet Take 180 mg by mouth daily.     No current facility-administered medications on file prior to visit.    BP 96/62 mmHg  Pulse 87  Temp(Src) 97.7 F (36.5 C) (Oral)  Ht 5\' 11"  (1.803 m)  Wt 184 lb (83.462 kg)  BMI 25.67 kg/m2  SpO2 97%  Objective:   Physical Exam  Constitutional: He is oriented to person, place, and time. He appears well-nourished.  Neck: Neck supple.  Cardiovascular: Normal rate and regular rhythm.   Pulmonary/Chest: Effort normal and breath sounds normal. He has no wheezes. He has no rales.  Neurological: He is alert and oriented to person, place, and time.  Skin: Skin is warm and dry.  Psychiatric: He has a normal mood and affect.          Assessment & Plan:

## 2016-03-03 NOTE — Assessment & Plan Note (Signed)
More so during spring/early summer months. Has been on Allegra for 10 years. Requires prednisone annually due to increased sinus pressure around this time of year.  Does not appear acutely ill. Lungs clear.  Will have him switch from Allegra to Zyrtec HS. Start Flonase. Rx for prednisone provided today for increased pressure.  Return precautions provided.

## 2016-04-01 ENCOUNTER — Other Ambulatory Visit: Payer: Self-pay | Admitting: Internal Medicine

## 2016-04-05 IMAGING — MR MR LUMBAR SPINE W/O CM
5 series · 45 of 48 positions shown · non-contrast
Comparison: No priors for comparison.

CLINICAL DATA: Low back and LEFT leg pain.  Five months duration.

EXAM:
MRI LUMBAR SPINE WITHOUT CONTRAST
TECHNIQUE: Multiplanar, multisequence MR imaging of the lumbar spine was
performed. No intravenous contrast was administered.

[Series 3: T2 · sagittal · 4.0mm · 0.88mm/px · 6 of 14 slices shown (1 of 2)]
[im 1/14]
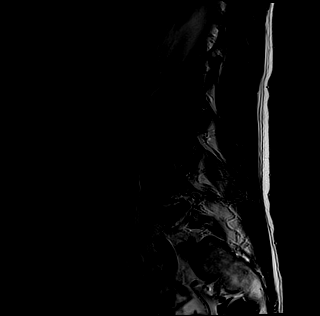
[im 3/14]
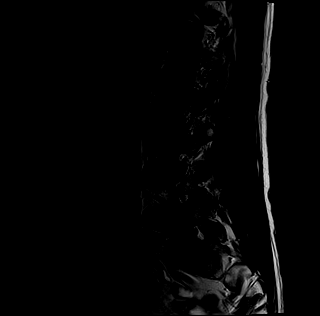
[im 6/14]
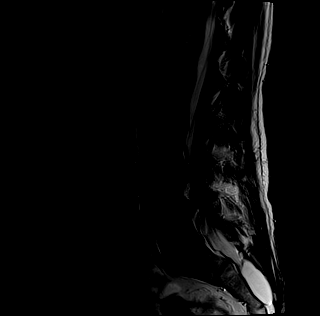
[im 8/14]
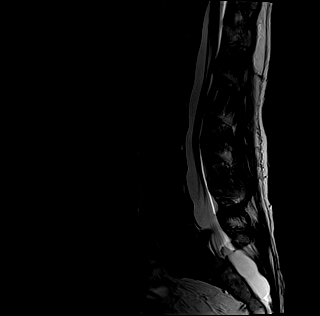
[im 11/14]
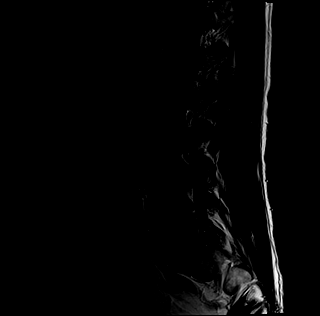
[im 14/14]
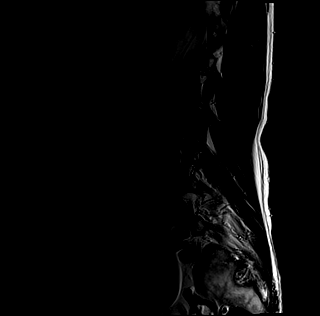

[Series 4: tirm sag · sagittal · 4.0mm · 0.55mm/px · 6 of 14 slices shown]
[im 1/14]
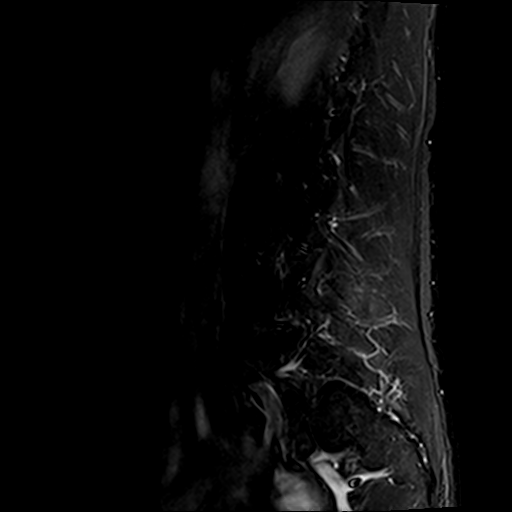
[im 3/14]
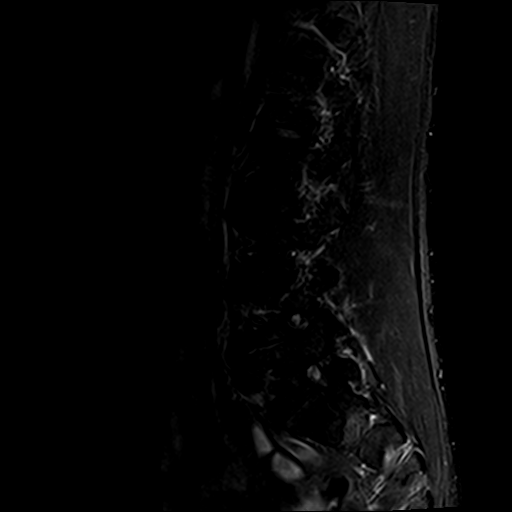
[im 6/14]
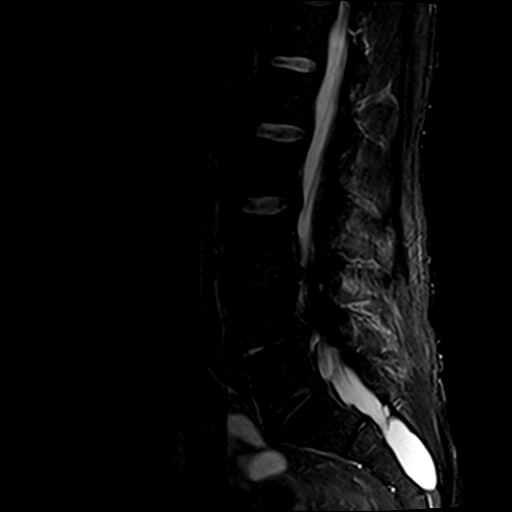
[im 8/14]
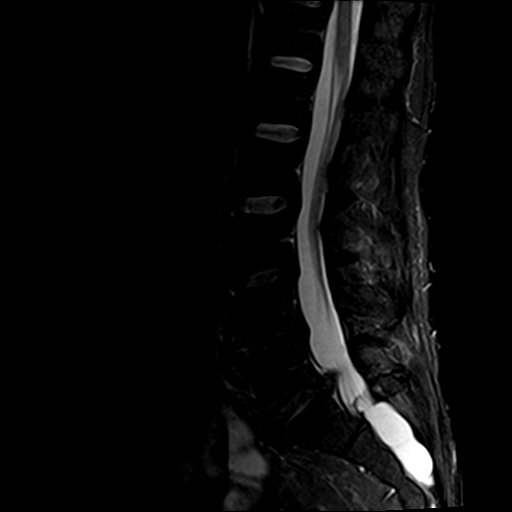
[im 11/14]
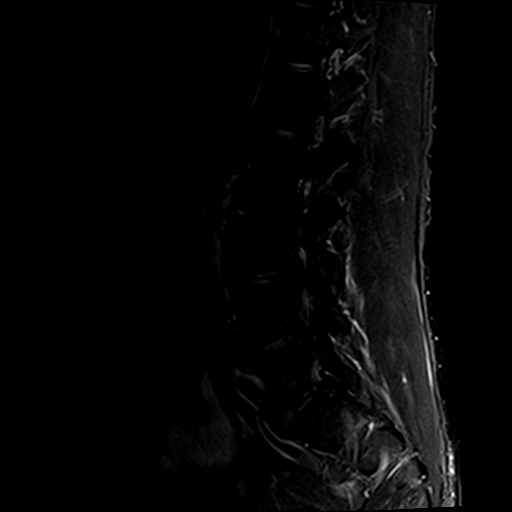
[im 14/14]
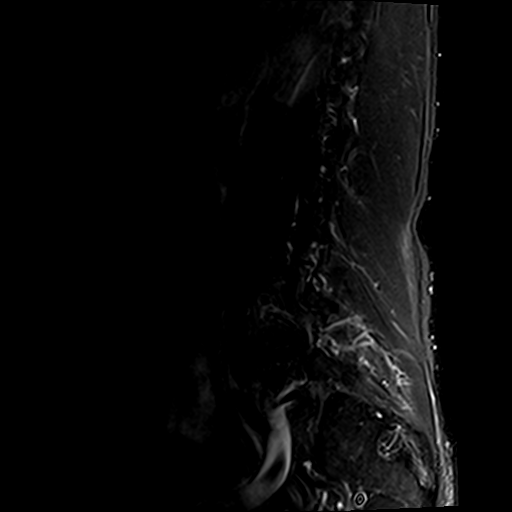

[Series 5: T1 · sagittal · 4.0mm · 0.88mm/px · 6 of 14 slices shown (1 of 2)]
[im 1/14]
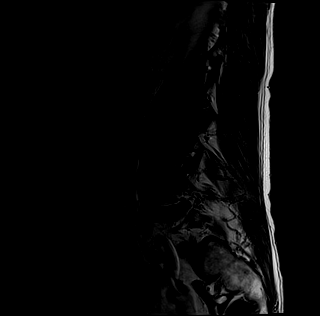
[im 3/14]
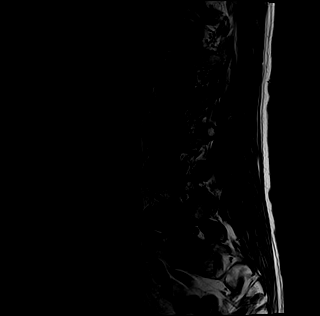
[im 6/14]
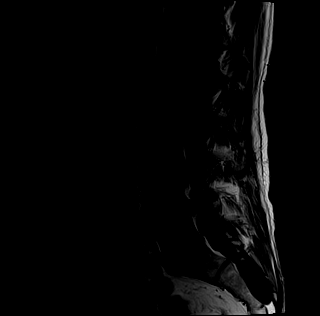
[im 8/14]
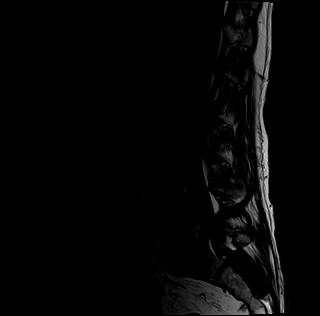
[im 11/14]
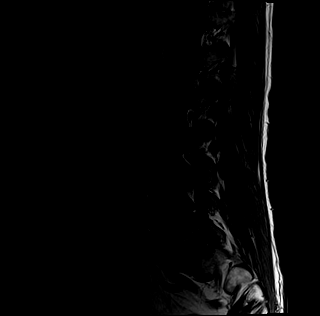
[im 14/14]
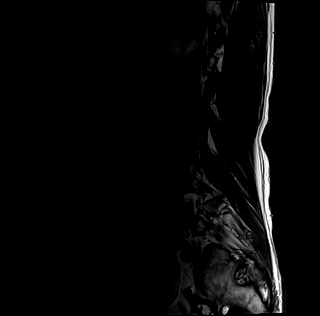

[Series 6: T1 · axial · 4.0mm · 0.70mm/px · z∈[-135,+57]mm · 12 of 33 slices shown (2 of 2)]
[im 1/33]
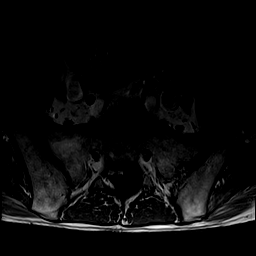
[im 3/33]
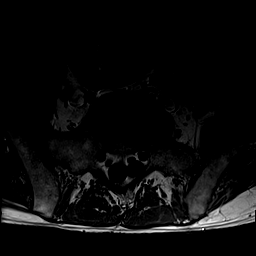
[im 5/33]
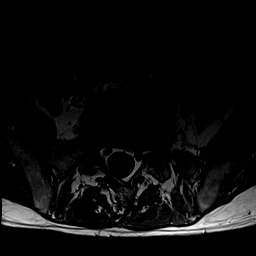
[im 7/33]
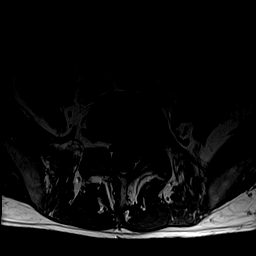
[im 10/33]
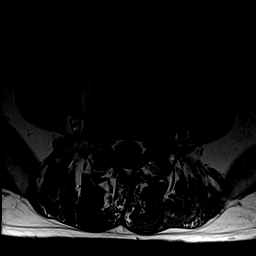
[im 12/33]
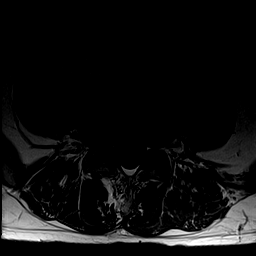
[im 14/33]
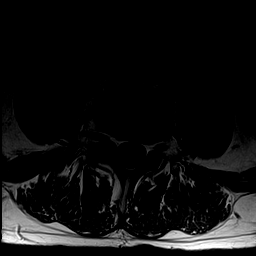
[im 17/33]
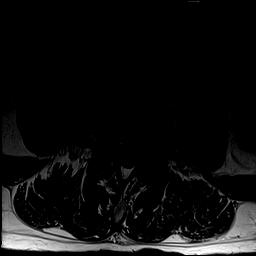
[im 19/33]
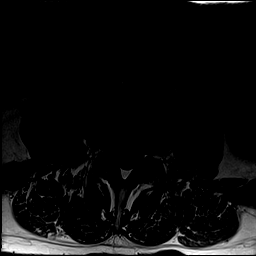
[im 23/33]
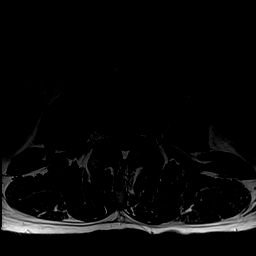
[im 28/33]
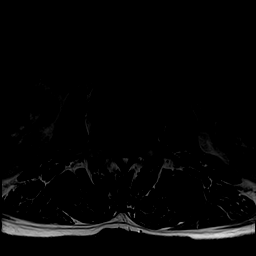
[im 33/33]
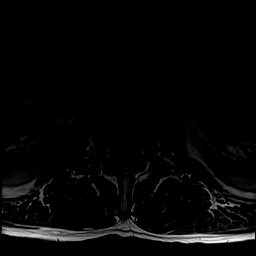

[Series 7: T2 · axial · 4.0mm · 0.70mm/px · z∈[-135,+57]mm · 15 of 33 slices shown (2 of 2)]
[im 1/33]
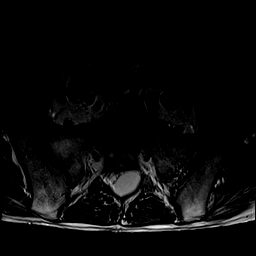
[im 3/33]
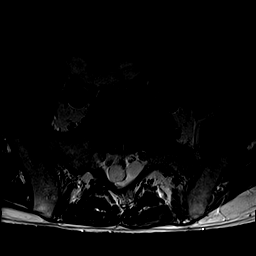
[im 5/33]
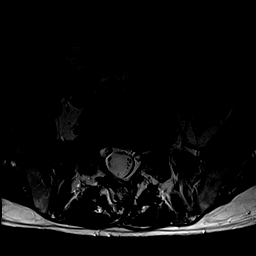
[im 7/33]
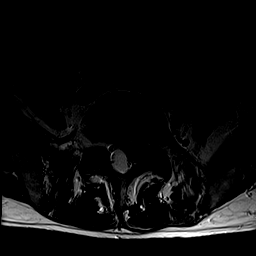
[im 10/33]
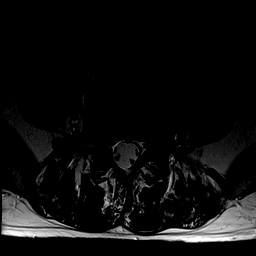
[im 12/33]
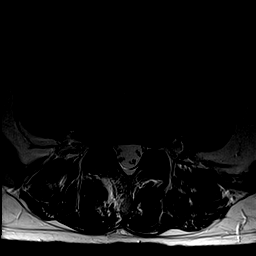
[im 14/33]
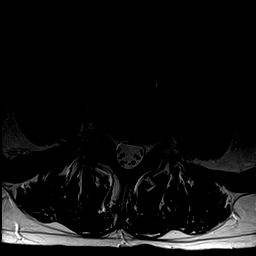
[im 17/33]
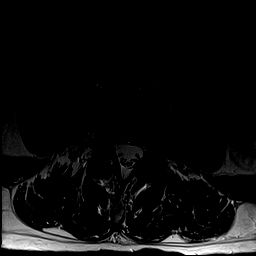
[im 19/33]
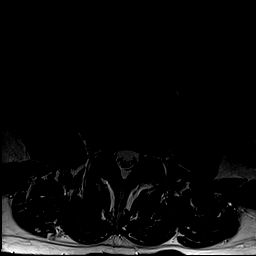
[im 21/33]
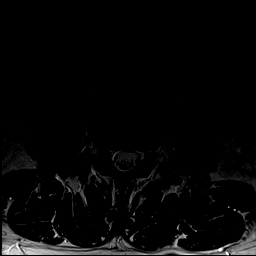
[im 23/33]
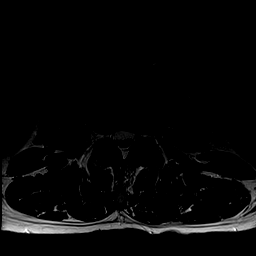
[im 26/33]
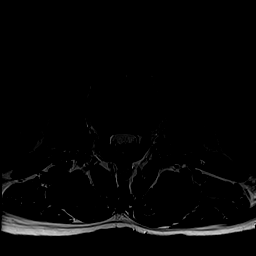
[im 28/33]
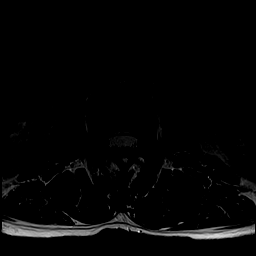
[im 30/33]
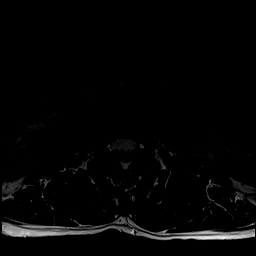
[im 33/33]
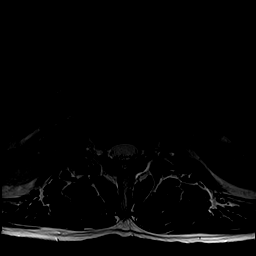

[45 of 48 positions shown; findings below may reference images not displayed]

FINDINGS: Segmentation: Normal.

Alignment:  Normal.

Vertebrae: No vertebral body compression or worrisome osseous
lesions. Chronic degenerative change is noted above and below the
narrow L4-5 disc space.

Conus medullaris: Normal.

Paraspinal tissues: No mass or hydronephrosis.

Disc levels:

L1-L2:  Normal.

L2-L3:  Normal.

L3-L4: Disc space narrowing. Mild facet arthropathy. Mild annular
bulging. No impingement.

L4-L5: Disc space narrowing eccentric to the RIGHT with lateral
spurring. Mild facet arthropathy. Mild annular bulging. No
impingement.

L5-S1: Slight disc desiccation. There is a large disc extrusion
central and to the LEFT, probable free fragment. Significant
compression LEFT S1 root is noted. Disc material also extends into
the LEFT foramen, compressing the LEFT L5 nerve root. Mild facet
arthropathy is superimposed.
IMPRESSION: The dominant abnormality is at L5-S1 where a large disc extrusion,
probable free fragment, extends central and to the LEFT. Significant
LEFT L5 and LEFT S1 nerve root impingement are observed.

Minor disc pathology with disc space narrowing at L3-4 and L4-5.

## 2016-11-22 ENCOUNTER — Ambulatory Visit (INDEPENDENT_AMBULATORY_CARE_PROVIDER_SITE_OTHER): Payer: BLUE CROSS/BLUE SHIELD | Admitting: Primary Care

## 2016-11-22 ENCOUNTER — Encounter: Payer: Self-pay | Admitting: Primary Care

## 2016-11-22 VITALS — BP 110/78 | HR 72 | Temp 97.7°F | Ht 71.25 in | Wt 194.8 lb

## 2016-11-22 DIAGNOSIS — J3489 Other specified disorders of nose and nasal sinuses: Secondary | ICD-10-CM

## 2016-11-22 MED ORDER — PREDNISONE 20 MG PO TABS: ORAL_TABLET | ORAL | 0 refills | Status: DC

## 2016-11-22 NOTE — Progress Notes (Signed)
Subjective:    Patient ID: Frank Cruz, male    DOB: 1961-09-02, 56 y.o.   MRN: [id number]  HPI  Frank Cruz is a 56 year old male with a history of allergic rhinitis and sinusitis who presents today with a chief complaint of sinus pressure. He also reports cough, chest congestion, headache. His symptoms began 6 weeks ago. He thinks he had influenza several weeks ago, he doesn't feel like he ever recovered completely with continuous sinus pressure, sinus congestion, persistent drainage. He denies fevers. He's currently taking Zyrtec daily and doing neti pot rinses without much improvement. Overall he's feeling well.  Review of Systems  Constitutional: Negative for fatigue and fever.  HENT: Positive for congestion, sinus pain and sinus pressure. Negative for ear pain and sore throat.   Respiratory: Positive for cough. Negative for shortness of breath.   Cardiovascular: Negative for chest pain.       Past Medical History:  Diagnosis Date  . Allergy   . DVT (deep venous thrombosis) ([facility]) 2013ish   left   . Hyperlipidemia   . Lumbago   . Vitamin D deficiency      Social History   Social History  . Marital status: Divorced    Spouse name: N/A  . Number of children: N/A  . Years of education: N/A   Occupational History  . Not on file.   Social History Main Topics  . Smoking status: Current Some Day Smoker    Years: 0.00    Types: Cigarettes  . Smokeless tobacco: Current User    Types: Snuff  . Alcohol use 0.0 oz/week     Comment: soical  . Drug use: No  . Sexual activity: Not on file   Other Topics Concern  . Not on file   Social History Narrative   Divorced.   3 children.   Works as an Product manager.   Enjoys building things, hiking, running       Past Surgical History:  Procedure Laterality Date  . LUMBAR LAMINECTOMY/DECOMPRESSION MICRODISCECTOMY Left 06/25/2014   Procedure: LUMBAR FIVE TO SACRAL ONE LUMBAR LAMINECTOMY/DECOMPRESSION  MICRODISCECTOMY 1 LEVEL;  Surgeon: Charlie Pitter, MD;  Location: [facility];  Service: Neurosurgery;  Laterality: Left;  . WISDOM TOOTH EXTRACTION      Family History  Problem Relation Age of Onset  . Colon cancer Neg Hx   . Hyperlipidemia Father   . Stroke Father   . Dementia Father     No Known Allergies  Current Outpatient Prescriptions on File Prior to Visit  Medication Sig Dispense Refill  . fexofenadine (ALLEGRA) 180 MG tablet Take 180 mg by mouth daily.     No current facility-administered medications on file prior to visit.     BP 110/78   Pulse 72   Temp 97.7 F (36.5 C) (Oral)   Ht 5' 11.25" (1.81 m)   Wt 194 lb 12.8 oz (88.4 kg)   SpO2 97%   BMI 26.98 kg/m    Objective:   Physical Exam  Constitutional: He appears well-nourished. He does not appear ill.  HENT:  Right Ear: Tympanic membrane and ear canal normal.  Left Ear: Tympanic membrane and ear canal normal.  Nose: Mucosal edema present. Right sinus exhibits maxillary sinus tenderness. Right sinus exhibits no frontal sinus tenderness. Left sinus exhibits maxillary sinus tenderness. Left sinus exhibits no frontal sinus tenderness.  Mouth/Throat: Oropharynx is clear and moist.  Eyes: Conjunctivae are normal.  Neck: Neck supple.  Cardiovascular: Normal rate and regular rhythm.   Pulmonary/Chest: Effort normal and breath sounds normal. He has no wheezes. He has no rales.  Skin: Skin is warm and dry.          Assessment & Plan:  Acute Sinus Pressure:  Present for 6 weeks since influenza. Overall feeling well, just continued sinus pressure. This occurs annually with seasonal changes. Exam today with clear lungs, maxillary sinus tenderness, does not appear ill. Will treat with prednisone taper as this has historically resolved symptoms. No antibiotics needed today given examination and HPI. Continue Zyrtec. Start Flonase after completion of Prednisone. Return precautions provided.   Sheral Flow, NP

## 2016-11-22 NOTE — Progress Notes (Signed)
Pre visit review using our clinic review tool, if applicable. No additional management support is needed unless otherwise documented below in the visit note. 

## 2016-11-22 NOTE — Patient Instructions (Signed)
Start prednisone tablets for sinus pressure.  Take 3 tablets for two days, then 2 tablets for two days, then 1 tablet for two days.  Nasal Congestion/Sinus Pressure: Try using Flonase (fluticasone) nasal spray after you complete the prednisone. Instill 1 spray in each nostril twice daily.   Continue Zyrtec.  It was a pleasure to see you today!

## 2017-10-27 ENCOUNTER — Ambulatory Visit: Payer: BLUE CROSS/BLUE SHIELD | Admitting: Family Medicine

## 2017-10-27 ENCOUNTER — Encounter: Payer: Self-pay | Admitting: Family Medicine

## 2017-10-27 VITALS — BP 116/74 | HR 72 | Temp 98.4°F | Ht 71.25 in | Wt 180.4 lb

## 2017-10-27 DIAGNOSIS — J329 Chronic sinusitis, unspecified: Secondary | ICD-10-CM

## 2017-10-27 DIAGNOSIS — L570 Actinic keratosis: Secondary | ICD-10-CM

## 2017-10-27 DIAGNOSIS — B078 Other viral warts: Secondary | ICD-10-CM

## 2017-10-27 MED ORDER — PREDNISONE 50 MG PO TABS: ORAL_TABLET | ORAL | 0 refills | Status: DC

## 2017-10-27 NOTE — Progress Notes (Signed)
    Subjective:  Frank Cruz is a 57 y.o. male who presents today for same-day appointment with a chief complaint of sinusitis.   HPI:  Sinusitis, Acute Issue Symptoms started about a week ago.  Worsened over last several days.  Associated with some cough and sneeze.  No fevers or chills.  No facial pressure.  A little pressure in his right ear.  He has been using saline nasal rinses which is helped a little bit.  No obvious sick contacts.  No obvious precipitating events.  No other obvious alleviating or aggravating factors.  Cutaneous wart, new issue Present for the past several weeks to months.  Located along the radial aspect of his left second digit.  No treatments tried.  No obvious precipitating events.  Skin lesion, new issue Present for the past several months.  Located on his left ear.  Feels like a "rough patch" that will not heal. Startred about a week ago.  ROS: Per HPI  PMH: He reports that he has been smoking cigarettes.  He has smoked for the past 0.00 years. His smokeless tobacco use includes snuff. He reports that he drinks alcohol. He reports that he does not use drugs.  Objective:  Physical Exam: BP 116/74 (BP Location: Left Arm, Patient Position: Sitting, Cuff Size: Normal)   Pulse 72   Temp 98.4 F (36.9 C) (Oral)   Ht 5' 11.25" (1.81 m)   Wt 180 lb 6.4 oz (81.8 kg)   SpO2 96%   BMI 24.98 kg/m   Gen: NAD, resting comfortably HEENT: EAC with cerumen bilaterally.  Oropharynx erythematous without exudate.  Maxillary sinuses with decreased transillumination bilaterally. CV: RRR with no murmurs appreciated Pulm: NWOB, CTAB with no crackles, wheezes, or rhonchi Skin: -Left hand: Approximately 3 mm hyperkeratotic lesion on radial aspect of index finger. -Left ear: Approximately 1 mm hyperkeratotic lesion on pinna  Cryotherapy Procedure Note  Pre-operative Diagnosis: Cutaneous wart and AK  Post-operative Diagnosis: Same  Locations: Cutaneous wart on  left second digit and AK on left ear  Indications: therapeutic  Procedure Details  Patient informed of risks (permanent scarring, infection, light or dark discoloration, bleeding, infection, weakness, numbness and recurrence of the lesion) and benefits of the procedure and verbal informed consent obtained.  The areas described above are treated with liquid nitrogen therapy, frozen until ice ball extended 2 mm beyond lesion, allowed to thaw, and treated again. The patient tolerated procedure well.  The patient was instructed on post-op care, warned that there may be blister formation, redness and pain. Recommend OTC analgesia as needed for pain.  Condition: Stable  Complications: none.  Assessment/Plan:  Sinusitis Likely secondary to viral URI. No signs of bacterial infection. Start prednisone burst. Recommended tylenol and/or motrin as needed for low grade fever and pain. Encouraged good oral hydration. Return precautions reviewed. Follow up as needed.   Cutaneous wart Cryotherapy applied today. See above procedure note.  AK Cryotherapy applied today. See above procedure note. Return precautions reviewed. Discussed sun protection.    Algis Greenhouse. Jerline Pain, MD 10/27/2017 2:03 PM

## 2023-06-22 ENCOUNTER — Ambulatory Visit: Payer: Medicaid Other | Admitting: Family Medicine

## 2023-06-22 ENCOUNTER — Encounter: Payer: Self-pay | Admitting: Family Medicine

## 2023-06-22 VITALS — BP 125/86 | HR 61 | Temp 98.4°F | Ht 72.0 in | Wt 171.1 lb

## 2023-06-22 DIAGNOSIS — K4090 Unilateral inguinal hernia, without obstruction or gangrene, not specified as recurrent: Secondary | ICD-10-CM

## 2023-06-22 DIAGNOSIS — R200 Anesthesia of skin: Secondary | ICD-10-CM

## 2023-06-22 DIAGNOSIS — E782 Mixed hyperlipidemia: Secondary | ICD-10-CM | POA: Diagnosis not present

## 2023-06-22 MED ORDER — PREDNISONE 50 MG PO TABS: ORAL_TABLET | ORAL | 0 refills | Status: DC

## 2023-06-22 NOTE — Patient Instructions (Addendum)
It was nice to see you today,  We addressed the following topics today: -I sent in a 5-day course of prednisone for you - For your colonoscopy I will send in a referral to the [facility] gastroenterologist, but it may be quicker if you just call them directly to schedule your next colonoscopy - Same thing applies to your neurosurgeon.  I would recommend calling them to discuss the numbness in your left leg.  If they request a new referral, I can send it in. - For your hernia, if he noticed that it is severely painful or if it starts to become discolored, or you cannot push it back in, you should not seek urgent medical attention for incarcerated hernia - You can schedule your lab visit for anytime in the next couple weeks.  It would be a fasting lab visit.  Have a great day,  Frederic Jericho, MD

## 2023-06-22 NOTE — Progress Notes (Signed)
New Patient Office Visit  Subjective    Patient ID: Frank Cruz, male    DOB: 1961/04/19  Age: 62 y.o. MRN: [id number]  CC:  Chief Complaint  Patient presents with   Establish Care    HPI Frank Cruz presents to establish care  Patient has not seen a medical provider in several years.  He was previously seeing a Vernona Rieger.  Patient complains of numbness and tingling in the left leg that is ongoing for the past 6 months and progressively becoming more frequent.  Happens almost daily now.  Patient has a history of disc disease and low back surgery.  Surgery was in 2016 he believes.  Patient states that he has had a "pouch" on the right side of his groin  for the past 15 years.  Occurred after lifting some heavy logs.  Does not ever get stuck or become very painful.  Never becomes discolored.  Patient has had a cough for the past months.  Initially felt unwell for 3 days but this cleared up now has persistent cough.  Has had issues with sinus infections in the past.  Has taken short courses of steroids in the past and cleared up his symptoms.  Patient started eating a low-carb, low sugar diet in February and has lost 20 pounds.  He says he has a family history of hyperlipidemia and a personal history of high cholesterol.   PMH: ruptured disc - 2015,   PSH: Lumbar laminectomy and surgical decompression.  FH: father - CVA, dementia;   Tobacco use: occasional cigarette use.   Alcohol use: 2-3/day Drug use: no Marital status: single, divorced.  3 daughters, 8 grandchildren.  Employment: retired.  Spends a lot of time volunteering.  Elder care, home improvement.     Screenings:  Colon Cancer: due - [place].      Outpatient Encounter Medications as of 06/22/2023  Medication Sig   cetirizine (ZYRTEC) 10 MG tablet Take 10 mg by mouth daily.   fexofenadine (ALLEGRA) 180 MG tablet Take 180 mg by mouth daily.   predniSONE (DELTASONE) 50 MG tablet Take 1 tablet daily  for 5 days.   [DISCONTINUED] predniSONE (DELTASONE) 50 MG tablet Take 1 tablet daily for 5 days. (Patient not taking: Reported on 06/22/2023)   No facility-administered encounter medications on file as of 06/22/2023.    Past Medical History:  Diagnosis Date   Allergy    DVT (deep venous thrombosis) ([facility]) 2013ish   left    Hyperlipidemia    Lumbago    Vitamin D deficiency     Past Surgical History:  Procedure Laterality Date   LUMBAR LAMINECTOMY/DECOMPRESSION MICRODISCECTOMY Left 06/25/2014   Procedure: LUMBAR FIVE TO SACRAL ONE LUMBAR LAMINECTOMY/DECOMPRESSION MICRODISCECTOMY 1 LEVEL;  Surgeon: Temple Pacini, MD;  Location: [facility];  Service: Neurosurgery;  Laterality: Left;   WISDOM TOOTH EXTRACTION      Family History  Problem Relation Age of Onset   Hyperlipidemia Father    Stroke Father    Dementia Father    Colon cancer Neg Hx     Social History   Socioeconomic History   Marital status: Divorced    Spouse name: Not on file   Number of children: Not on file   Years of education: Not on file   Highest education level: Not on file  Occupational History   Not on file  Tobacco Use   Smoking status: Some Days    Types: Cigarettes   Smokeless tobacco:  Current    Types: Snuff  Substance and Sexual Activity   Alcohol use: Yes    Alcohol/week: 0.0 standard drinks of alcohol    Comment: soical   Drug use: No   Sexual activity: Not on file  Other Topics Concern   Not on file  Social History Narrative   Divorced.   3 children.   Works as an Interior and spatial designer.   Enjoys building things, hiking, running   Social Determinants of Health   Financial Resource Strain: Not on file  Food Insecurity: Not on file  Transportation Needs: Not on file  Physical Activity: Not on file  Stress: Not on file  Social Connections: Not on file  Intimate Partner Violence: Not on file    ROS      Objective    BP 125/86   Pulse 61   Temp 98.4 F (36.9 C)  (Oral)   Ht 6' (1.829 m)   Wt 171 lb 1.3 oz (77.6 kg)   SpO2 97%   BMI 23.20 kg/m   Physical Exam General: Alert, oriented HEENT: PERRLA, EOMI, moist mucosa CV: Regular rate and rhythm Pulmonary: Lungs clear bilaterally no wheeze or crackle GI: Soft, and bowel sounds MSK: Strength equal bilaterally GU: Small reducible inguinal hernia on the right side. Psych: Pleasant affect, Skin: Warm and dry.      Assessment & Plan:   Moderate mixed hyperlipidemia not requiring statin therapy Assessment & Plan: Overall healthy.  Will recheck lipid panel.  Previous lipid panel several years ago.  Orders: -     Lipid panel; Future -     Hemoglobin A1c; Future  Leg numbness Assessment & Plan: Chronic intermittent numbness and tingling in the left leg that has been progressing.  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  Status post surgical laminectomy approximately 10 years ago.  Current symptoms likely related to his chronic lumbar issues.  Advised patient to reach out to the previous neurosurgeons office who performed his surgery.  Advised him we could send in referral if necessary.  Discussed red flag symptoms requiring immediate attention including muscular weakness, saddle anesthesia, anesthesia that does not resolve, incontinence.  Orders: -     CBC With Differential; Future -     Comprehensive metabolic panel; Future  Unilateral inguinal hernia without obstruction or gangrene, recurrence not specified Assessment & Plan: Right-sided inguinal hernia has been present for several years.  Has never had issues with not being able to reduce it.  Not painful.  Discussed option of referral to general surgery with patient.  Discussed reasons to seek immediate medical attention for incarcerated hernia.  Patient will hold off on referral at this time.   Other orders -     predniSONE; Take 1 tablet daily for 5 days.  Dispense: 5 tablet; Refill:  0    Return in about 2 months (around 08/22/2023) for hld.   Sandre Kitty, MD

## 2023-06-23 ENCOUNTER — Other Ambulatory Visit: Payer: Medicaid Other

## 2023-06-23 DIAGNOSIS — R200 Anesthesia of skin: Secondary | ICD-10-CM | POA: Insufficient documentation

## 2023-06-23 DIAGNOSIS — K4090 Unilateral inguinal hernia, without obstruction or gangrene, not specified as recurrent: Secondary | ICD-10-CM | POA: Insufficient documentation

## 2023-06-23 DIAGNOSIS — E782 Mixed hyperlipidemia: Secondary | ICD-10-CM

## 2023-06-23 NOTE — Assessment & Plan Note (Signed)
Overall healthy.  Will recheck lipid panel.  Previous lipid panel several years ago.

## 2023-06-23 NOTE — Assessment & Plan Note (Signed)
Chronic intermittent numbness and tingling in the left leg that has been progressing.  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  Status post surgical laminectomy approximately 10 years ago.  Current symptoms likely related to his chronic lumbar issues.  Advised patient to reach out to the previous neurosurgeons office who performed his surgery.  Advised him we could send in referral if necessary.  Discussed red flag symptoms requiring immediate attention including muscular weakness, saddle anesthesia, anesthesia that does not resolve, incontinence.

## 2023-06-23 NOTE — Assessment & Plan Note (Signed)
Right-sided inguinal hernia has been present for several years.  Has never had issues with not being able to reduce it.  Not painful.  Discussed option of referral to general surgery with patient.  Discussed reasons to seek immediate medical attention for incarcerated hernia.  Patient will hold off on referral at this time.

## 2023-06-24 LAB — CBC WITH DIFFERENTIAL/PLATELET
Basophils Absolute: 0.1 10*3/uL (ref 0.0–0.2)
Basos: 1 %
EOS (ABSOLUTE): 0.2 10*3/uL (ref 0.0–0.4)
Eos: 3 %
Hematocrit: 45.7 % (ref 37.5–51.0)
Hemoglobin: 15 g/dL (ref 13.0–17.7)
Immature Grans (Abs): 0 10*3/uL (ref 0.0–0.1)
Immature Granulocytes: 0 %
Lymphocytes Absolute: 2.3 10*3/uL (ref 0.7–3.1)
Lymphs: 26 %
MCH: 31.8 pg (ref 26.6–33.0)
MCHC: 32.8 g/dL (ref 31.5–35.7)
MCV: 97 fL (ref 79–97)
Monocytes Absolute: 0.7 10*3/uL (ref 0.1–0.9)
Monocytes: 8 %
Neutrophils Absolute: 5.4 10*3/uL (ref 1.4–7.0)
Neutrophils: 62 %
Platelets: 164 10*3/uL (ref 150–450)
RBC: 4.72 x10E6/uL (ref 4.14–5.80)
RDW: 12.7 % (ref 11.6–15.4)
WBC: 8.7 10*3/uL (ref 3.4–10.8)

## 2023-06-24 LAB — COMPREHENSIVE METABOLIC PANEL WITH GFR
ALT: 27 IU/L (ref 0–44)
AST: 29 IU/L (ref 0–40)
Albumin: 4.7 g/dL (ref 3.9–4.9)
Alkaline Phosphatase: 42 IU/L — ABNORMAL LOW (ref 44–121)
BUN/Creatinine Ratio: 12 (ref 10–24)
BUN: 11 mg/dL (ref 8–27)
Bilirubin Total: 0.3 mg/dL (ref 0.0–1.2)
CO2: 22 mmol/L (ref 20–29)
Calcium: 9.8 mg/dL (ref 8.6–10.2)
Chloride: 100 mmol/L (ref 96–106)
Creatinine, Ser: 0.95 mg/dL (ref 0.76–1.27)
Globulin, Total: 2 g/dL (ref 1.5–4.5)
Glucose: 84 mg/dL (ref 70–99)
Potassium: 4.2 mmol/L (ref 3.5–5.2)
Sodium: 137 mmol/L (ref 134–144)
Total Protein: 6.7 g/dL (ref 6.0–8.5)
eGFR: 90 mL/min/1.73 (ref >59)

## 2023-06-24 LAB — LIPID PANEL
Chol/HDL Ratio: 3.2 {ratio} (ref 0.0–5.0)
Cholesterol, Total: 253 mg/dL — ABNORMAL HIGH (ref 100–199)
HDL: 78 mg/dL (ref >39)
LDL Chol Calc (NIH): 146 mg/dL — ABNORMAL HIGH (ref 0–99)
Triglycerides: 165 mg/dL — ABNORMAL HIGH (ref 0–149)
VLDL Cholesterol Cal: 29 mg/dL (ref 5–40)

## 2023-06-24 LAB — HEMOGLOBIN A1C
Est. average glucose Bld gHb Est-mCnc: 111 mg/dL
Hgb A1c MFr Bld: 5.5 % (ref 4.8–5.6)

## 2023-06-29 ENCOUNTER — Telehealth: Payer: Self-pay | Admitting: Family Medicine

## 2023-06-29 NOTE — Telephone Encounter (Signed)
Pt is stating that he is going out of town to the [place] part of the state to work for 3 weeks and is wanting to know if Abx can be sent in to see  if it would help at all. Pt states that if the xray is done and appt is needed then he would not be available to do the appointment.

## 2023-06-29 NOTE — Telephone Encounter (Signed)
Patient should get a chest x-ray for his chronic cough.  He can schedule an appointment with me so that we can talk about the x-ray and other test we need to do for his chronic cough.  He does not need a different steroid sent in.

## 2023-06-29 NOTE — Telephone Encounter (Signed)
Patient called into the office stating the prednisone is not helping. Still having a lot of phlegm, cough and feels his neck is still swollen.  What is your recommendation?  A different medication or do you need to see him?  Please advise.

## 2023-07-03 NOTE — Telephone Encounter (Signed)
Antibiotics for a chronic cough are not usually indicated, especially without a chest x-ray to show evidence of a bacterial infection.  He can take over-the-counter Claritin and Flonase nasal spray daily for 4 weeks.  Allergies leading to postnasal drip is a common cause of chronic cough.  If that does not improve his cough we can discuss what to do next.

## 2023-09-04 ENCOUNTER — Ambulatory Visit: Payer: Medicaid Other | Admitting: Family Medicine

## 2023-12-25 ENCOUNTER — Encounter: Payer: Self-pay | Admitting: Gastroenterology

## 2024-01-11 DIAGNOSIS — H2511 Age-related nuclear cataract, right eye: Secondary | ICD-10-CM | POA: Diagnosis not present

## 2024-02-23 ENCOUNTER — Encounter: Payer: Self-pay | Admitting: Gastroenterology

## 2024-02-23 ENCOUNTER — Ambulatory Visit: Admitting: Gastroenterology

## 2024-02-23 VITALS — BP 112/68 | HR 73 | Ht 71.0 in | Wt 170.1 lb

## 2024-02-23 DIAGNOSIS — Z860100 Personal history of colon polyps, unspecified: Secondary | ICD-10-CM

## 2024-02-23 DIAGNOSIS — Z01818 Encounter for other preprocedural examination: Secondary | ICD-10-CM | POA: Diagnosis not present

## 2024-02-23 DIAGNOSIS — Z860101 Personal history of adenomatous and serrated colon polyps: Secondary | ICD-10-CM | POA: Diagnosis not present

## 2024-02-23 MED ORDER — NA SULFATE-K SULFATE-MG SULF 17.5-3.13-1.6 GM/177ML PO SOLN: 1.0000 | Freq: Once | ORAL | 0 refills | Status: AC

## 2024-02-23 NOTE — Progress Notes (Signed)
 I agree with the assessment and plan as outlined by Ms. May.

## 2024-02-23 NOTE — Patient Instructions (Signed)
 You have been scheduled for a colonoscopy. Please follow written instructions given to you at your visit today.   If you use inhalers (even only as needed), please bring them with you on the day of your procedure.  DO NOT TAKE 7 DAYS PRIOR TO TEST- Trulicity (dulaglutide) Ozempic, Wegovy (semaglutide) Mounjaro (tirzepatide) Bydureon Bcise (exanatide extended release)  DO NOT TAKE 1 DAY PRIOR TO YOUR TEST Rybelsus (semaglutide) Adlyxin (lixisenatide) Victoza (liraglutide) Byetta (exanatide) ___________________________________________________________________________    Due to recent changes in healthcare laws, you may see the results of your imaging and laboratory studies on MyChart before your provider has had a chance to review them.  We understand that in some cases there may be results that are confusing or concerning to you. Not all laboratory results come back in the same time frame and the provider may be waiting for multiple results in order to interpret others.  Please give us  48 hours in order for your provider to thoroughly review all the results before contacting the office for clarification of your results.    _______________________________________________________  If your blood pressure at your visit was 140/90 or greater, please contact your primary care physician to follow up on this.  _______________________________________________________  If you are age 67 or older, your body mass index should be between 23-30. Your Body mass index is 23.73 kg/m. If this is out of the aforementioned range listed, please consider follow up with your Primary Care Provider.  If you are age 47 or younger, your body mass index should be between 19-25. Your Body mass index is 23.73 kg/m. If this is out of the aformentioned range listed, please consider follow up with your Primary Care Provider.   ________________________________________________________  The [facility] providers would  like to encourage you to use MYCHART to communicate with providers for non-urgent requests or questions.  Due to long hold times on the telephone, sending your provider a message by [facility] may be a faster and more efficient way to get a response.  Please allow 48 business hours for a response.  Please remember that this is for non-urgent requests.  _______________________________________________________  Thank you for trusting me with your gastrointestinal care. Deanna May, RNP

## 2024-02-23 NOTE — Progress Notes (Signed)
 Chief Complaint:colonoscopy Primary GI Doctor: (previously Dr. Sandrea Cruel) Dr. Rosaline Coma   HPI:  Patient is a  63 year old male patient with past medical history of DVT (not on blood thinner), hyperlipidemia, vitamin D  deficiency, who presents to discuss colon screening colonoscopy.  Interval History   Patient presents for follow-up for colon screening. Patient denies altered bowel habits, abdominal pain, or rectal bleeding. He states 3 years ago he was experiencing rectal bleeding for extended period of time and craving sugar. He cut out all sugar and changed diet because he thought he Frank Cruz have candida infection. He states after he made dietary changes, the rectal bleeding stopped.  He is very active and works out several days a week.   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  No family history of GI issues or CA.  Wt Readings from Last 3 Encounters:  02/23/24 170 lb 2 oz (77.2 kg)  06/22/23 171 lb 1.3 oz (77.6 kg)  10/27/17 180 lb 6.4 oz (81.8 kg)    Past Medical History:  Diagnosis Date   Allergy    DVT (deep venous thrombosis) (HCC) 2013ish   left    Hyperlipidemia    Lumbago    Vitamin D  deficiency     Past Surgical History:  Procedure Laterality Date   LUMBAR LAMINECTOMY/DECOMPRESSION MICRODISCECTOMY Left 06/25/2014   Procedure: LUMBAR FIVE TO SACRAL ONE LUMBAR LAMINECTOMY/DECOMPRESSION MICRODISCECTOMY 1 LEVEL;  Surgeon: Baruch Bosch, MD;  Location: [facility];  Service: Neurosurgery;  Laterality: Left;   WISDOM TOOTH EXTRACTION      No current outpatient medications on file.   No current facility-administered medications for this visit.   Allergies as of 02/23/2024   (No Known Allergies)    Family History  Problem Relation Age of Onset   Hyperlipidemia Father    Stroke Father    Dementia Father    Colon cancer Neg Hx     Review of Systems:    Constitutional: No weight loss, fever, chills, weakness or fatigue HEENT: Eyes: No change in vision                Ears, Nose, Throat:  No change in hearing or congestion Skin: No rash or itching Cardiovascular: No chest pain, chest pressure or palpitations   Respiratory: No SOB or cough Gastrointestinal: See HPI and otherwise negative Genitourinary: No dysuria or change in urinary frequency Neurological: No headache, dizziness or syncope Musculoskeletal: No new muscle or joint pain Hematologic: No bleeding or bruising Psychiatric: No history of depression or anxiety    Physical Exam:  Vital signs: BP 112/68 (BP Location: Left Arm, Patient Position: Sitting, Cuff Size: Normal)   Pulse 73   Ht 5\' 11"  (1.803 m)   Wt 170 lb 2 oz (77.2 kg)   BMI 23.73 kg/m   Constitutional:   Pleasant  male appears to be in NAD, Well developed, Well nourished, alert and cooperative Throat: Oral cavity and pharynx without inflammation, swelling or lesion.  Respiratory: Respirations even and unlabored. Lungs clear to auscultation bilaterally.   No wheezes, crackles, or rhonchi.  Cardiovascular: Normal S1, S2. Regular rate and rhythm. No peripheral edema, cyanosis or pallor.  Gastrointestinal:  Soft, nondistended, nontender. No rebound or guarding. Normal bowel sounds. No appreciable masses or hepatomegaly. Rectal:  Not performed.  Msk:  Symmetrical without gross deformities. Without edema, no deformity or joint abnormality.  Neurologic:  Alert and  oriented x4;  grossly normal neurologically.  Skin:   Dry and intact  without significant lesions or rashes. Psychiatric: Oriented to person, place and time. Demonstrates good judgement and reason without abnormal affect or behaviors.  RELEVANT LABS AND IMAGING: CBC    Latest Ref Rng & Units 06/23/2023   10:21 AM 02/23/2016    4:05 PM 06/25/2014    7:22 AM  CBC  WBC 3.4 - 10.8 x10E3/uL 8.7  6.8  3.9   Hemoglobin 13.0 - 17.7 g/dL 19.1  47.8  29.5   Hematocrit 37.5 - 51.0 % 45.7  48.9  44.4   Platelets 150 - 450 x10E3/uL 164  145  128      CMP     Latest Ref  Rng & Units 06/23/2023   10:21 AM 02/23/2016    4:05 PM 06/25/2014    7:22 AM  CMP  Glucose 70 - 99 mg/dL 84  76  92   BUN 8 - 27 mg/dL 11  19  15    Creatinine 0.76 - 1.27 mg/dL 6.21  3.08  6.57   Sodium 134 - 144 mmol/L 137  139  139   Potassium 3.5 - 5.2 mmol/L 4.2  4.3  4.7   Chloride 96 - 106 mmol/L 100  101  101   CO2 20 - 29 mmol/L 22  26  31    Calcium 8.6 - 10.2 mg/dL 9.8  9.8  9.3   Total Protein 6.0 - 8.5 g/dL 6.7  6.7    Total Bilirubin 0.0 - 1.2 mg/dL 0.3  0.4    Alkaline Phos 44 - 121 IU/L 42  41    AST 0 - 40 IU/L 29  18    ALT 0 - 44 IU/L 27  19       Lab Results  Component Value Date   TSH 3.349 05/07/2014  05/28/2015 colonoscopy, recall 3 years 1. Sessile polyp at the ileocecal valve; polypectomy performed with a cold snare  2. Four sessile polyps in the sigmoid colon; polypectomies performed with a cold snare  3. The examination was otherwise normal Path:  Surgical [P], ileocecal valve, sigmoid, polyp (5) - TUBULAR ADENOMA AND SESSILE SERRATED POLYP (4). NO HIGH GRADE DYSPLASIA OR MALIGNANCY IDENTIFIED. Assessment: Encounter Diagnosis  Name Primary?   History of colonic polyps Yes    63 year old male patient with history of tubular adenomas due for colon screening colonoscopy, will schedule today with Dr. Rosaline Coma in [facility].  Patient currently does not have any GI symptoms.  Plan: - Schedule for a colonoscopy in [facility] with Dr. Rosaline Coma. The risks and benefits of colonoscopy with possible polypectomy / biopsies were discussed and the patient agrees to proceed.    Thank you for the courtesy of this consult. Please call me with any questions or concerns.   [name], FNP-C [facility] 02/23/2024, 11:02 AM  Cc: Laneta Pintos, MD

## 2024-03-01 ENCOUNTER — Encounter: Payer: Self-pay | Admitting: Internal Medicine

## 2024-03-01 ENCOUNTER — Ambulatory Visit (AMBULATORY_SURGERY_CENTER): Admitting: Internal Medicine

## 2024-03-01 VITALS — BP 137/93 | HR 58 | Temp 98.1°F | Resp 14 | Ht 71.0 in | Wt 170.0 lb

## 2024-03-01 DIAGNOSIS — Z1211 Encounter for screening for malignant neoplasm of colon: Secondary | ICD-10-CM | POA: Diagnosis not present

## 2024-03-01 DIAGNOSIS — K5730 Diverticulosis of large intestine without perforation or abscess without bleeding: Secondary | ICD-10-CM | POA: Diagnosis not present

## 2024-03-01 DIAGNOSIS — D123 Benign neoplasm of transverse colon: Secondary | ICD-10-CM | POA: Diagnosis not present

## 2024-03-01 DIAGNOSIS — K648 Other hemorrhoids: Secondary | ICD-10-CM

## 2024-03-01 DIAGNOSIS — D128 Benign neoplasm of rectum: Secondary | ICD-10-CM | POA: Diagnosis not present

## 2024-03-01 DIAGNOSIS — D125 Benign neoplasm of sigmoid colon: Secondary | ICD-10-CM

## 2024-03-01 DIAGNOSIS — E785 Hyperlipidemia, unspecified: Secondary | ICD-10-CM | POA: Diagnosis not present

## 2024-03-01 DIAGNOSIS — Z860100 Personal history of colon polyps, unspecified: Secondary | ICD-10-CM | POA: Diagnosis not present

## 2024-03-01 DIAGNOSIS — K635 Polyp of colon: Secondary | ICD-10-CM | POA: Diagnosis not present

## 2024-03-01 DIAGNOSIS — D127 Benign neoplasm of rectosigmoid junction: Secondary | ICD-10-CM | POA: Diagnosis not present

## 2024-03-01 DIAGNOSIS — D120 Benign neoplasm of cecum: Secondary | ICD-10-CM

## 2024-03-01 MED ORDER — SODIUM CHLORIDE 0.9 % IV SOLN: 500.0000 mL | Freq: Once | INTRAVENOUS | Status: DC

## 2024-03-01 NOTE — Progress Notes (Signed)
 Called to room to assist during endoscopic procedure.  Patient ID and intended procedure confirmed with present staff. Received instructions for my participation in the procedure from the performing physician.

## 2024-03-01 NOTE — Progress Notes (Signed)
 Updated medical record.

## 2024-03-01 NOTE — Progress Notes (Signed)
 GASTROENTEROLOGY PROCEDURE H&P NOTE   Primary Care Physician: Laneta Pintos, MD    Reason for Procedure:   History of colon polyps  Plan:    Colonoscopy  Patient is appropriate for endoscopic procedure(s) in the ambulatory ([facility]) setting.  The nature of the procedure, as well as the risks, benefits, and alternatives were carefully and thoroughly reviewed with the patient. Ample time for discussion and questions allowed. The patient understood, was satisfied, and agreed to proceed.     HPI: Frank Cruz is a 63 y.o. male who presents for colonoscopy for evaluation of history of colon polyps.  Patient was most recently seen in the Gastroenterology Clinic on 02/23/24.  No interval change in medical history since that appointment. Please refer to that note for full details regarding GI history and clinical presentation.   Past Medical History:  Diagnosis Date   Allergy    DVT (deep venous thrombosis) (HCC) 2013ish   left    Hyperlipidemia    Lumbago    Vitamin D  deficiency     Past Surgical History:  Procedure Laterality Date   COLONOSCOPY  2016   polyps   LUMBAR LAMINECTOMY/DECOMPRESSION MICRODISCECTOMY Left 06/25/2014   Procedure: LUMBAR FIVE TO SACRAL ONE LUMBAR LAMINECTOMY/DECOMPRESSION MICRODISCECTOMY 1 LEVEL;  Surgeon: Baruch Bosch, MD;  Location: [facility];  Service: Neurosurgery;  Laterality: Left;   WISDOM TOOTH EXTRACTION      Prior to Admission medications   Not on File    No current outpatient medications on file.   Current Facility-Administered Medications  Medication Dose Route Frequency Provider Last Rate Last Admin   0.9 %  sodium chloride  infusion  500 mL Intravenous Once Daina Drum, MD        Allergies as of 03/01/2024   (No Known Allergies)    Family History  Problem Relation Age of Onset   Hyperlipidemia Father    Stroke Father    Dementia Father    Colon cancer Neg Hx    Colon polyps Neg Hx    Esophageal cancer Neg Hx     Stomach cancer Neg Hx    Rectal cancer Neg Hx     Social History   Socioeconomic History   Marital status: Divorced    Spouse name: Not on file   Number of children: 3   Years of education: Not on file   Highest education level: Not on file  Occupational History   Not on file  Tobacco Use   Smoking status: Some Days    Types: Cigarettes   Smokeless tobacco: Current    Types: Snuff  Vaping Use   Vaping status: Never Used  Substance and Sexual Activity   Alcohol use: Yes    Alcohol/week: 0.0 standard drinks of alcohol    Comment: soical   Drug use: No   Sexual activity: Not on file  Other Topics Concern   Not on file  Social History Narrative   Divorced.   3 children.   Works as an Interior and spatial designer.   Enjoys building things, hiking, running   Social Drivers of Corporate investment banker Strain: Not on file  Food Insecurity: Not on file  Transportation Needs: Not on file  Physical Activity: Not on file  Stress: Not on file  Social Connections: Not on file  Intimate Partner Violence: Not on file    Physical Exam: Vital signs in last 24 hours: BP (!) 82/60   Pulse 64   Temp  98.1 F (36.7 C) (Temporal)   Ht 5' 11 (1.803 m)   Wt 170 lb (77.1 kg)   SpO2 98%   BMI 23.71 kg/m  GEN: NAD EYE: Sclerae anicteric ENT: MMM CV: Non-tachycardic Pulm: No increased WOB GI: Soft NEURO:  Alert & Oriented   Regino Caprio, MD [facility]   03/01/2024 2:36 PM

## 2024-03-01 NOTE — Progress Notes (Signed)
 Sedate, gd SR, tolerated procedure well, VSS, report to RN

## 2024-03-01 NOTE — Patient Instructions (Addendum)
 Non-bleeding internal hemorrhoids. Recommendation:           - Discharge patient to home (with escort).                           - Await pathology results.                           - The findings and recommendations were discussed                            with the patient.  Handout on polyps given       YOU HAD AN ENDOSCOPIC PROCEDURE TODAY AT THE [facility]:   Refer to the procedure report that was given to you for any specific questions about what was found during the examination.  If the procedure report does not answer your questions, please call your gastroenterologist to clarify.  If you requested that your care partner not be given the details of your procedure findings, then the procedure report has been included in a sealed envelope for you to review at your convenience later.  YOU SHOULD EXPECT: Some feelings of bloating in the abdomen. Passage of more gas than usual.  Walking can help get rid of the air that was put into your GI tract during the procedure and reduce the bloating. If you had a lower endoscopy (such as a colonoscopy or flexible sigmoidoscopy) you may notice spotting of blood in your stool or on the toilet paper. If you underwent a bowel prep for your procedure, you may not have a normal bowel movement for a few days.  Please Note:  You might notice some irritation and congestion in your nose or some drainage.  This is from the oxygen used during your procedure.  There is no need for concern and it should clear up in a day or so.  SYMPTOMS TO REPORT IMMEDIATELY:  Following lower endoscopy (colonoscopy or flexible sigmoidoscopy):  Excessive amounts of blood in the stool  Significant tenderness or worsening of abdominal pains  Swelling of the abdomen that is new, acute  Fever of 100F or higher   For urgent or emergent issues, a gastroenterologist can be reached at any hour by calling [phone]. Do not use MyChart messaging for urgent  concerns.    DIET:  We do recommend a small meal at first, but then you may proceed to your regular diet.  Drink plenty of fluids but you should avoid alcoholic beverages for 24 hours.  ACTIVITY:  You should plan to take it easy for the rest of today and you should NOT DRIVE or use heavy machinery until tomorrow (because of the sedation medicines used during the test).    FOLLOW UP: Our staff will call the number listed on your records the next business day following your procedure.  We will call around 7:15- 8:00 am to check on you and address any questions or concerns that you may have regarding the information given to you following your procedure. If we do not reach you, we will leave a message.     If any biopsies were taken you will be contacted by phone or by letter within the next 1-3 weeks.  Please call us at [phone] if you have not heard about the biopsies in 3 weeks.    SIGNATURES/CONFIDENTIALITY: You and/or your care partner  have signed paperwork which will be entered into your electronic medical record.  These signatures attest to the fact that that the information above on your After Visit Summary has been reviewed and is understood.  Full responsibility of the confidentiality of this discharge information lies with you and/or your care-partner.

## 2024-03-01 NOTE — Op Note (Signed)
 [facility] Patient Name: Frank Cruz Procedure Date: 03/01/2024 2:37 PM MRN: [id number] Endoscopist: Pedro Bourgeois , , [id number] Age: 63 Referring MD:  Date of Birth: [birth year] Gender: Male Account #: [id number] Procedure:                Colonoscopy Indications:              High risk colon cancer surveillance: Personal                            history of colonic polyps Medicines:                Monitored Anesthesia Care Procedure:                Pre-Anesthesia Assessment:                           - Prior to the procedure, a History and Physical                            was performed, and patient medications and                            allergies were reviewed. The patient's tolerance of                            previous anesthesia was also reviewed. The risks                            and benefits of the procedure and the sedation                            options and risks were discussed with the patient.                            All questions were answered, and informed consent                            was obtained. Prior Anticoagulants: The patient has                            taken no anticoagulant or antiplatelet agents. ASA                            Grade Assessment: II - A patient with mild systemic                            disease. After reviewing the risks and benefits,                            the patient was deemed in satisfactory condition to                            undergo the procedure.  After obtaining informed consent, the colonoscope                            was passed under direct vision. Throughout the                            procedure, the patient's blood pressure, pulse, and                            oxygen saturations were monitored continuously. The                            CF [id number] #[id number] was introduced through the anus                            and advanced to the the terminal  ileum. The                            colonoscopy was performed without difficulty. The                            patient tolerated the procedure well. The quality                            of the bowel preparation was good. The terminal                            ileum, ileocecal valve, appendiceal orifice, and                            rectum were photographed. Scope In: 2:54:55 PM Scope Out: 3:19:49 PM Scope Withdrawal Time: 0 hours 21 minutes 34 seconds  Total Procedure Duration: 0 hours 24 minutes 54 seconds  Findings:                 The terminal ileum appeared normal.                           Two sessile polyps were found in the transverse                            colon and cecum. The polyps were 4 to 6 mm in size.                            These polyps were removed with a cold snare.                            Resection and retrieval were complete.                           Multiple diverticula were found in the sigmoid                            colon and descending colon.  An 11 mm polyp was found in the sigmoid colon. The                            polyp was pedunculated. The polyp was removed with                            a hot snare. Resection and retrieval were complete.                           Two sessile polyps were found in the rectum and                            sigmoid colon. The polyps were 3 to 5 mm in size.                            These polyps were removed with a cold snare.                            Resection and retrieval were complete.                           Non-bleeding internal hemorrhoids were found during                            retroflexion. Complications:            No immediate complications. Estimated Blood Loss:     Estimated blood loss was minimal. Impression:               - The examined portion of the ileum was normal.                           - Two 4 to 6 mm polyps in the transverse colon and                             in the cecum, removed with a cold snare. Resected                            and retrieved.                           - Diverticulosis in the sigmoid colon and in the                            descending colon.                           - One 11 mm polyp in the sigmoid colon, removed                            with a hot snare. Resected and retrieved.                           - Two 3 to  5 mm polyps in the rectum and in the                            sigmoid colon, removed with a cold snare. Resected                            and retrieved.                           - Non-bleeding internal hemorrhoids. Recommendation:           - Discharge patient to home (with escort).                           - Await pathology results.                           - The findings and recommendations were discussed                            with the patient. Dr Apolonio Kluver,  03/01/2024 3:23:25 PM

## 2024-03-04 ENCOUNTER — Telehealth: Payer: Self-pay

## 2024-03-04 NOTE — Telephone Encounter (Signed)
  Follow up Call-     03/01/2024    2:18 PM  Call back number  Post procedure Call Back phone  # [phone]  Permission to leave phone message Yes     Patient questions:  Do you have a fever, pain , or abdominal swelling? No. Pain Score  0 *  Have you tolerated food without any problems? Yes.    Have you been able to return to your normal activities? Yes.    Do you have any questions about your discharge instructions: Diet   No. Medications  No. Follow up visit  No.  Do you have questions or concerns about your Care? No.  Actions: * If pain score is 4 or above: No action needed, pain <4.

## 2024-03-06 ENCOUNTER — Ambulatory Visit: Payer: Self-pay | Admitting: Internal Medicine

## 2024-03-06 LAB — SURGICAL PATHOLOGY

## 2024-07-31 ENCOUNTER — Telehealth: Payer: Self-pay

## 2024-07-31 NOTE — Telephone Encounter (Signed)
 Copied from CRM [id number]. Topic: Appointments - Appointment Scheduling >> Jul 31, 2024  2:36 PM Emylou G wrote: Patient called.. would like bloodwork done w/o physical can  we do this? Pls call patient

## 2024-07-31 NOTE — Telephone Encounter (Signed)
No, he needs a physical

## 2024-08-01 NOTE — Telephone Encounter (Signed)
 Patient is schedule

## 2024-09-04 ENCOUNTER — Ambulatory Visit: Admitting: Family Medicine

## 2024-09-04 ENCOUNTER — Encounter: Payer: Self-pay | Admitting: Family Medicine

## 2024-09-04 VITALS — BP 108/64 | HR 57 | Ht 71.0 in | Wt 170.8 lb

## 2024-09-04 DIAGNOSIS — E782 Mixed hyperlipidemia: Secondary | ICD-10-CM | POA: Diagnosis not present

## 2024-09-04 DIAGNOSIS — Z131 Encounter for screening for diabetes mellitus: Secondary | ICD-10-CM

## 2024-09-04 DIAGNOSIS — H6120 Impacted cerumen, unspecified ear: Secondary | ICD-10-CM | POA: Insufficient documentation

## 2024-09-04 DIAGNOSIS — Z0000 Encounter for general adult medical examination without abnormal findings: Secondary | ICD-10-CM | POA: Diagnosis not present

## 2024-09-04 DIAGNOSIS — H6123 Impacted cerumen, bilateral: Secondary | ICD-10-CM | POA: Diagnosis not present

## 2024-09-04 DIAGNOSIS — Z125 Encounter for screening for malignant neoplasm of prostate: Secondary | ICD-10-CM

## 2024-09-04 NOTE — Assessment & Plan Note (Signed)
 History of elevated cholesterol. He is not interested in starting medication at this time. - Continue current management. Will monitor with labs.

## 2024-09-04 NOTE — Progress Notes (Unsigned)
 Annual physical  Subjective   Patient ID: Frank Cruz, male    DOB: Jan 10, 1961  Age: 63 y.o. MRN: [id number]  Chief Complaint  Patient presents with   Annual Exam   HPI Frank Cruz is a 63 y.o. old male here  for annual exam.   Subjective - Presents for a physical exam. Reports feeling well with no new concerns. - Reports ongoing numbness in the foot with walking or running, which is consistent with previous symptoms. This occurred during a recent hiking trip with a heavy pack. Back pain is not present when not active. - Reports a history of significant cerumen impaction, which has previously affected hearing. Requests an ear examination.  Medications - No current medications.  PMH: Hypercholesterolemia. PSH: None. FH: Father had a stroke in his 83s. Social Hx: Denies smoking or illicit drug use. Reports occasional alcohol use. Lives alone.  ROS: Constitutional: Denies fever, chills. HEENT: Reports cerumen impaction. Denies hearing loss. CV: Denies chest pain, palpitations. PULM: Denies shortness of breath, cough. GI: Denies nausea, vomiting, diarrhea. GU: Denies dysuria, hematuria. MSK: Denies back pain. Reports foot numbness with activity. NEURO: Denies headache, dizziness. PSYCH: Denies depression, anxiety.   The 10-year ASCVD risk score (Arnett DK, et al., 2019) is: 6.3%  Health Maintenance Due  Topic Date Due   Pneumococcal Vaccine: 50+ Years (1 of 1 - PCV) Never done   Zoster Vaccines- Shingrix (1 of 2) Never done   Influenza Vaccine  Never done   DTaP/Tdap/Td (2 - Td or Tdap) 05/07/2024   COVID-19 Vaccine (1 - 2025-26 season) Never done      Objective:     BP 108/64   Pulse (!) 57   Ht 5' 11 (1.803 m)   Wt 170 lb 12.8 oz (77.5 kg)   SpO2 100%   BMI 23.82 kg/m    Physical Exam Gen: alert, oriented HEENT: perrla, eomi, mmm. Otoscopy reveals cerumen impaction bilaterally. Tympanic membranes are not visualized. CV: rrr, no murmur Pulm: lctab. No  wheeze or crackles.  GI: soft, nbs.  Nontender to palpation MSK: strength equal b/l. Normal gait Ext: no pedal edema Skin: warm and dry, no rashes Psych: pleasant affect.  Spontaneous speech      Assessment & Plan:   Physical exam, annual  Mixed hyperlipidemia Assessment & Plan: History of elevated cholesterol. He is not interested in starting medication at this time. - Continue current management. Will monitor with labs.  Orders: -     CBC with Differential/Platelet -     Comprehensive metabolic panel with GFR -     Lipid panel  Prostate cancer screening -     PSA  Screening for diabetes mellitus -     Hemoglobin A1c  Healthcare maintenance Assessment & Plan: The patient is here for a routine physical examination. He is due for annual labs. - Repeat labs: CBC, CMP, liver panel, A1c. - Offered PSA for prostate cancer screening, which he agreed to add to the lab order. - Discussed flu vaccination, which he will receive.   Bilateral impacted cerumen Assessment & Plan: Reports a history of significant wax buildup. Examination confirms bilateral cerumen impaction. - Plan: - Advised on using Debrox drops to soften the wax. - Discussed using liquid Colace as an alternative over-the-counter option for softening cerumen. - Advised to place drops in the ear, keep in place with a cotton ball for 5 minutes, then allow to drain. - He has bulb syringes at home for irrigation after softening.  Return in about 1 year (around 09/04/2025) for physical.    Frank MARLA Slain, MD

## 2024-09-04 NOTE — Patient Instructions (Addendum)
 It was nice to see you today,  We addressed the following topics today: - you can use Debrox or liquid Colace drops in your ears to help soften the wax. - Please proceed to the lab for your blood work. I will let you know the results when I get them  Have a great day,  Rolan Slain, MD

## 2024-09-04 NOTE — Assessment & Plan Note (Signed)
 The patient is here for a routine physical examination. He is due for annual labs. - Repeat labs: CBC, CMP, liver panel, A1c. - Offered PSA for prostate cancer screening, which he agreed to add to the lab order. - Discussed flu vaccination, which he will receive.

## 2024-09-04 NOTE — Assessment & Plan Note (Signed)
 Reports a history of significant wax buildup. Examination confirms bilateral cerumen impaction. - Plan: - Advised on using Debrox drops to soften the wax. - Discussed using liquid Colace as an alternative over-the-counter option for softening cerumen. - Advised to place drops in the ear, keep in place with a cotton ball for 5 minutes, then allow to drain. - He has bulb syringes at home for irrigation after softening.

## 2024-09-05 ENCOUNTER — Ambulatory Visit: Payer: Self-pay | Admitting: Family Medicine

## 2024-09-05 LAB — LIPID PANEL
Chol/HDL Ratio: 2.6 ratio (ref 0.0–5.0)
Cholesterol, Total: 209 mg/dL — ABNORMAL HIGH (ref 100–199)
HDL: 80 mg/dL (ref >39)
LDL Chol Calc (NIH): 118 mg/dL — ABNORMAL HIGH (ref 0–99)
Triglycerides: 62 mg/dL (ref 0–149)
VLDL Cholesterol Cal: 11 mg/dL (ref 5–40)

## 2024-09-05 LAB — PSA: Prostate Specific Ag, Serum: 0.5 ng/mL (ref 0.0–4.0)

## 2024-09-05 LAB — CBC WITH DIFFERENTIAL/PLATELET
Basophils Absolute: 0.1 x10E3/uL (ref 0.0–0.2)
Basos: 2 %
EOS (ABSOLUTE): 0.2 x10E3/uL (ref 0.0–0.4)
Eos: 4 %
Hematocrit: 47.4 % (ref 37.5–51.0)
Hemoglobin: 15.8 g/dL (ref 13.0–17.7)
Immature Grans (Abs): 0 x10E3/uL (ref 0.0–0.1)
Immature Granulocytes: 0 %
Lymphocytes Absolute: 1.4 x10E3/uL (ref 0.7–3.1)
Lymphs: 29 %
MCH: 31.5 pg (ref 26.6–33.0)
MCHC: 33.3 g/dL (ref 31.5–35.7)
MCV: 95 fL (ref 79–97)
Monocytes Absolute: 0.4 x10E3/uL (ref 0.1–0.9)
Monocytes: 8 %
Neutrophils Absolute: 2.6 x10E3/uL (ref 1.4–7.0)
Neutrophils: 57 %
Platelets: 159 x10E3/uL (ref 150–450)
RBC: 5.01 x10E6/uL (ref 4.14–5.80)
RDW: 12.9 % (ref 11.6–15.4)
WBC: 4.7 x10E3/uL (ref 3.4–10.8)

## 2024-09-05 LAB — COMPREHENSIVE METABOLIC PANEL WITH GFR
ALT: 22 IU/L (ref 0–44)
AST: 20 IU/L (ref 0–40)
Albumin: 4.3 g/dL (ref 3.9–4.9)
Alkaline Phosphatase: 45 IU/L — ABNORMAL LOW (ref 47–123)
BUN/Creatinine Ratio: 9 — ABNORMAL LOW (ref 10–24)
BUN: 9 mg/dL (ref 8–27)
Bilirubin Total: 0.4 mg/dL (ref 0.0–1.2)
CO2: 25 mmol/L (ref 20–29)
Calcium: 9.6 mg/dL (ref 8.6–10.2)
Chloride: 100 mmol/L (ref 96–106)
Creatinine, Ser: 0.98 mg/dL (ref 0.76–1.27)
Globulin, Total: 1.9 g/dL (ref 1.5–4.5)
Glucose: 96 mg/dL (ref 70–99)
Potassium: 4.9 mmol/L (ref 3.5–5.2)
Sodium: 137 mmol/L (ref 134–144)
Total Protein: 6.2 g/dL (ref 6.0–8.5)
eGFR: 87 mL/min/1.73 (ref >59)

## 2024-09-05 LAB — HEMOGLOBIN A1C
Est. average glucose Bld gHb Est-mCnc: 108 mg/dL
Hgb A1c MFr Bld: 5.4 % (ref 4.8–5.6)

## 2024-11-12 ENCOUNTER — Ambulatory Visit
